# Patient Record
Sex: Male | Born: 1960 | Race: Black or African American | Hispanic: No | Marital: Married | Smoking: Never smoker
Health system: Southern US, Community
[De-identification: ages and names within clinical notes are randomized; demographics above are authoritative.]

## PROBLEM LIST (undated history)

## (undated) DIAGNOSIS — J343 Hypertrophy of nasal turbinates: Secondary | ICD-10-CM

## (undated) DIAGNOSIS — J42 Unspecified chronic bronchitis: Secondary | ICD-10-CM

## (undated) DIAGNOSIS — I10 Essential (primary) hypertension: Secondary | ICD-10-CM

## (undated) DIAGNOSIS — J342 Deviated nasal septum: Secondary | ICD-10-CM

## (undated) DIAGNOSIS — J45909 Unspecified asthma, uncomplicated: Secondary | ICD-10-CM

## (undated) HISTORY — DX: Unspecified asthma, uncomplicated: J45.909

---

## 1999-09-30 ENCOUNTER — Emergency Department (HOSPITAL_COMMUNITY): Admission: EM | Admit: 1999-09-30 | Discharge: 1999-09-30 | Payer: Self-pay | Admitting: Emergency Medicine

## 2013-12-14 ENCOUNTER — Emergency Department (HOSPITAL_COMMUNITY)
Admission: EM | Admit: 2013-12-14 | Discharge: 2013-12-14 | Disposition: A | Discharge status: Home / Self Care | Payer: 59 | Attending: Emergency Medicine | Admitting: Emergency Medicine

## 2013-12-14 ENCOUNTER — Encounter (HOSPITAL_COMMUNITY): Payer: Self-pay | Admitting: Emergency Medicine

## 2013-12-14 ENCOUNTER — Emergency Department (HOSPITAL_COMMUNITY): Payer: 59

## 2013-12-14 DIAGNOSIS — R55 Syncope and collapse: Secondary | ICD-10-CM | POA: Insufficient documentation

## 2013-12-14 DIAGNOSIS — Z79899 Other long term (current) drug therapy: Secondary | ICD-10-CM | POA: Insufficient documentation

## 2013-12-14 DIAGNOSIS — R05 Cough: Secondary | ICD-10-CM | POA: Insufficient documentation

## 2013-12-14 DIAGNOSIS — I10 Essential (primary) hypertension: Secondary | ICD-10-CM | POA: Insufficient documentation

## 2013-12-14 DIAGNOSIS — Z792 Long term (current) use of antibiotics: Secondary | ICD-10-CM | POA: Insufficient documentation

## 2013-12-14 DIAGNOSIS — R059 Cough, unspecified: Secondary | ICD-10-CM | POA: Insufficient documentation

## 2013-12-14 DIAGNOSIS — IMO0002 Reserved for concepts with insufficient information to code with codable children: Secondary | ICD-10-CM | POA: Insufficient documentation

## 2013-12-14 HISTORY — DX: Essential (primary) hypertension: I10

## 2013-12-14 LAB — BASIC METABOLIC PANEL
BUN: 12 mg/dL (ref 6–23)
CO2: 26 mEq/L (ref 19–32)
Calcium: 9.8 mg/dL (ref 8.4–10.5)
Chloride: 100 mEq/L (ref 96–112)
Creatinine, Ser: 1.06 mg/dL (ref 0.50–1.35)
GFR calc Af Amer: >90 mL/min (ref >90)
GFR, EST NON AFRICAN AMERICAN: 79 mL/min — AB (ref >90)
GLUCOSE: 88 mg/dL (ref 70–99)
Potassium: 4.8 mEq/L (ref 3.7–5.3)
Sodium: 138 mEq/L (ref 137–147)

## 2013-12-14 LAB — CBC WITH DIFFERENTIAL/PLATELET
BASOS PCT: 0 % (ref 0–1)
Basophils Absolute: 0 10*3/uL (ref 0.0–0.1)
EOS ABS: 0.4 10*3/uL (ref 0.0–0.7)
Eosinophils Relative: 4 % (ref 0–5)
HCT: 38.8 % — ABNORMAL LOW (ref 39.0–52.0)
Hemoglobin: 13 g/dL (ref 13.0–17.0)
LYMPHS ABS: 2.6 10*3/uL (ref 0.7–4.0)
Lymphocytes Relative: 27 % (ref 12–46)
MCH: 29.5 pg (ref 26.0–34.0)
MCHC: 33.5 g/dL (ref 30.0–36.0)
MCV: 88.2 fL (ref 78.0–100.0)
MONOS PCT: 5 % (ref 3–12)
Monocytes Absolute: 0.5 10*3/uL (ref 0.1–1.0)
Neutro Abs: 6.2 10*3/uL (ref 1.7–7.7)
Neutrophils Relative %: 64 % (ref 43–77)
Platelets: 235 10*3/uL (ref 150–400)
RBC: 4.4 MIL/uL (ref 4.22–5.81)
RDW: 13.8 % (ref 11.5–15.5)
WBC: 9.6 10*3/uL (ref 4.0–10.5)

## 2013-12-14 NOTE — ED Notes (Addendum)
Pt eyes NOT pinpoint. Pt reports may have choked a little bit. Pt reports was sitting and eating. Pt reports "sandwich may have gone down the wrong pipe then went out." Pt remembers eating and coughing then people running toward him. Pt denies fall.  Pt denies feeling like something in throat.

## 2013-12-14 NOTE — ED Notes (Signed)
Bed: [id number] Expected date:  Expected time:  Means of arrival:  Comments: EMS- syncope, AMS

## 2013-12-14 NOTE — Discharge Instructions (Signed)
Return to the emergency department if your symptoms recur, you develop chest pain, difficulty breathing, or any other new and concerning symptoms   Syncope Syncope is a fainting spell. This means the person loses consciousness and drops to the ground. The person is generally unconscious for less than 5 minutes. The person may have some muscle twitches for up to 15 seconds before waking up and returning to normal. Syncope occurs more often in elderly people, but it can happen to anyone. While most causes of syncope are not dangerous, syncope can be a sign of a serious medical problem. It is important to seek medical care.  CAUSES  Syncope is caused by a sudden decrease in blood flow to the brain. The specific cause is often not determined. Factors that can trigger syncope include:  Taking medicines that lower blood pressure.  Sudden changes in posture, such as standing up suddenly.  Taking more medicine than prescribed.  Standing in one place for too long.  Seizure disorders.  Dehydration and excessive exposure to heat.  Low blood sugar (hypoglycemia).  Straining to have a bowel movement.  Heart disease, irregular heartbeat, or other circulatory problems.  Fear, emotional distress, seeing blood, or severe pain. SYMPTOMS  Right before fainting, you may:  Feel dizzy or lightheaded.  Feel nauseous.  See all white or all black in your field of vision.  Have cold, clammy skin. DIAGNOSIS  Your caregiver will ask about your symptoms, perform a physical exam, and perform electrocardiography (ECG) to record the electrical activity of your heart. Your caregiver may also perform other heart or blood tests to determine the cause of your syncope. TREATMENT  In most cases, no treatment is needed. Depending on the cause of your syncope, your caregiver may recommend changing or stopping some of your medicines. HOME CARE INSTRUCTIONS  Have someone stay with you until you feel stable.  Do  not drive, operate machinery, or play sports until your caregiver says it is okay.  Keep all follow-up appointments as directed by your caregiver.  Lie down right away if you start feeling like you might faint. Breathe deeply and steadily. Wait until all the symptoms have passed.  Drink enough fluids to keep your urine clear or pale yellow.  If you are taking blood pressure or heart medicine, get up slowly, taking several minutes to sit and then stand. This can reduce dizziness. SEEK IMMEDIATE MEDICAL CARE IF:   You have a severe headache.  You have unusual pain in the chest, abdomen, or back.  You are bleeding from the mouth or rectum, or you have black or tarry stool.  You have an irregular or very fast heartbeat.  You have pain with breathing.  You have repeated fainting or seizure-like jerking during an episode.  You faint when sitting or lying down.  You have confusion.  You have difficulty walking.  You have severe weakness.  You have vision problems. If you fainted, call your local emergency services (911 in [place] Do not drive yourself to the hospital.  MAKE SURE YOU:  Understand these instructions.  Will watch your condition.  Will get help right away if you are not doing well or get worse. Document Released: 07/15/2005 Document Revised: 01/14/2012 Document Reviewed: 09/13/2011 [facility] Patient Information 2014 [employer], [place].

## 2013-12-14 NOTE — ED Notes (Signed)
Delo MD at bedside. 

## 2013-12-14 NOTE — ED Notes (Addendum)
Per [employer] at work with syncopal episode with LOC. Bystanders at work reports pt choking and pt "out for 10-20 seconds." Pt denies choking but does not remember event. Per [facility] eyes pinpoint.

## 2013-12-14 NOTE — ED Provider Notes (Signed)
CSN: [id number]     Arrival date & time 12/14/13  1245 History   First MD Initiated Contact with Patient 12/14/13 1255     Chief Complaint  Patient presents with  . Loss of Consciousness     (Consider location/radiation/quality/duration/timing/severity/associated sxs/prior Treatment) HPI Comments: Patient is a 53 year old male with history of hypertension. He presents today with complaints of syncope. He states he was in the cafeteria at work eating with his coworkers when he choked on a ham sandwich. States that he coughed forcefully and repeatedly. While doing this he blacked out for approximately 5 seconds and became unresponsive. He woke up immediately afterward and has been without complaints since this time. He has no complaints at present.  Patient is a 53 y.o. male presenting with syncope. The history is provided by the patient.  Loss of Consciousness Episode history:  Single Most recent episode:  Today Duration:  5 seconds Timing:  Constant Progression:  Resolved Chronicity:  New Witnessed: yes   Relieved by:  Nothing Worsened by:  Nothing tried   Past Medical History  Diagnosis Date  . Hypertension    History reviewed. No pertinent past surgical history. No family history on file. History  Substance Use Topics  . Smoking status: Never Smoker   . Smokeless tobacco: Not on file  . Alcohol Use: 1.2 oz/week    2 Cans of beer per week     Comment: socially    Review of Systems  Cardiovascular: Positive for syncope.  All other systems reviewed and are negative.     Allergies  Review of patient's allergies indicates no known allergies.  Home Medications   Prior to Admission medications   Medication Sig Start Date End Date Taking? Authorizing Provider  albuterol (PROVENTIL HFA;VENTOLIN HFA) 108 (90 BASE) MCG/ACT inhaler Inhale 2 puffs into the lungs every 6 (six) hours as needed for wheezing or shortness of breath.   Yes Historical Provider, MD  amLODipine  (NORVASC) 10 MG tablet Take 10 mg by mouth daily.   Yes Historical Provider, MD  Cholecalciferol (VITAMIN D PO) Take 1 tablet by mouth daily.   Yes Historical Provider, MD  doxycycline (VIBRA-TABS) 100 MG tablet Take 100 mg by mouth daily.   Yes Historical Provider, MD  fluticasone (FLONASE) 50 MCG/ACT nasal spray Place 2 sprays into both nostrils daily.   Yes Historical Provider, MD  losartan (COZAAR) 25 MG tablet Take 50 mg by mouth daily.    Yes Historical Provider, MD   BP 142/88  Pulse 97  Temp(Src) 98.1 F (36.7 C) (Oral)  Resp 20  SpO2 98% Physical Exam  Nursing note and vitals reviewed. Constitutional: He is oriented to person, place, and time. He appears well-developed and well-nourished. No distress.  HENT:  Head: Normocephalic and atraumatic.  Mouth/Throat: Oropharynx is clear and moist.  Eyes: EOM are normal. Pupils are equal, round, and reactive to light.  Neck: Normal range of motion. Neck supple.  Cardiovascular: Normal rate, regular rhythm and normal heart sounds.   No murmur heard. Pulmonary/Chest: Effort normal and breath sounds normal. No respiratory distress. He has no wheezes.  Abdominal: Soft. Bowel sounds are normal. He exhibits no distension. There is no tenderness.  Musculoskeletal: Normal range of motion. He exhibits no edema.  Lymphadenopathy:    He has no cervical adenopathy.  Neurological: He is alert and oriented to person, place, and time. No cranial nerve deficit. He exhibits normal muscle tone. Coordination normal.  Skin: Skin is warm and dry. He  is not diaphoretic.    ED Course  Procedures (including critical care time) Labs Review Labs Reviewed  CBC WITH DIFFERENTIAL - Abnormal; Notable for the following:    HCT 38.8 (*)    All other components within normal limits  BASIC METABOLIC PANEL - Abnormal; Notable for the following:    GFR calc non Af Amer 79 (*)    All other components within normal limits    Imaging Review Dg Chest 2  View  12/14/2013   CLINICAL DATA:  Loss of consciousness  EXAM: CHEST  2 VIEW  COMPARISON:  None.  FINDINGS: Lungs are clear. Heart size and pulmonary vascularity are normal. No adenopathy. There is degenerative change in the thoracic spine.  IMPRESSION: No edema or consolidation.   Electronically Signed   By: Bretta BangWilliam  Woodruff M.D.   On: 12/14/2013 13:37     EKG Interpretation   Date/Time:  Tuesday Dec 14 2013 12:47:46 EDT Ventricular Rate:  81 PR Interval:  132 QRS Duration: 93 QT Interval:  364 QTC Calculation: 422 R Axis:   37 Text Interpretation:  Sinus rhythm Minimal ST depression, inferior leads  Confirmed by DELOS  MD, Seldon Barrell ([id number]) on 12/14/2013 2:31:47 PM      MDM   Final diagnoses:  None    Patient is a 53 year old male who presents after a syncopal episode that occurred while coughing after choking on a ham sandwich. This sounds like an episode of ptosis syncope as the patient is now symptom free and feels fine. His vital signs are stable, he is not orthostatic, and laboratory studies are all unremarkable. Chest x-ray reveals no abnormality. At this point I feel as though he is appropriate for discharge. He understands to return if his symptoms recur or worsen.    Geoffery Lyonsouglas Olin Gurski, MD 12/14/13 [phone]

## 2015-06-12 ENCOUNTER — Other Ambulatory Visit: Payer: Self-pay | Admitting: Allergy

## 2015-06-12 MED ORDER — ALBUTEROL SULFATE HFA 108 (90 BASE) MCG/ACT IN AERS: 2.0000 | INHALATION_SPRAY | RESPIRATORY_TRACT | Status: DC | PRN

## 2015-06-20 ENCOUNTER — Other Ambulatory Visit: Payer: Self-pay | Admitting: Allergy

## 2015-06-20 MED ORDER — FLUTICASONE PROPIONATE 50 MCG/ACT NA SUSP: 2.0000 | Freq: Every day | NASAL | Status: DC

## 2015-09-21 IMAGING — CR DG CHEST 2V
2 series · 2 of 2 positions shown · non-contrast
Comparison: None.

CLINICAL DATA: Loss of consciousness

EXAM:
CHEST  2 VIEW

[w chest pa]
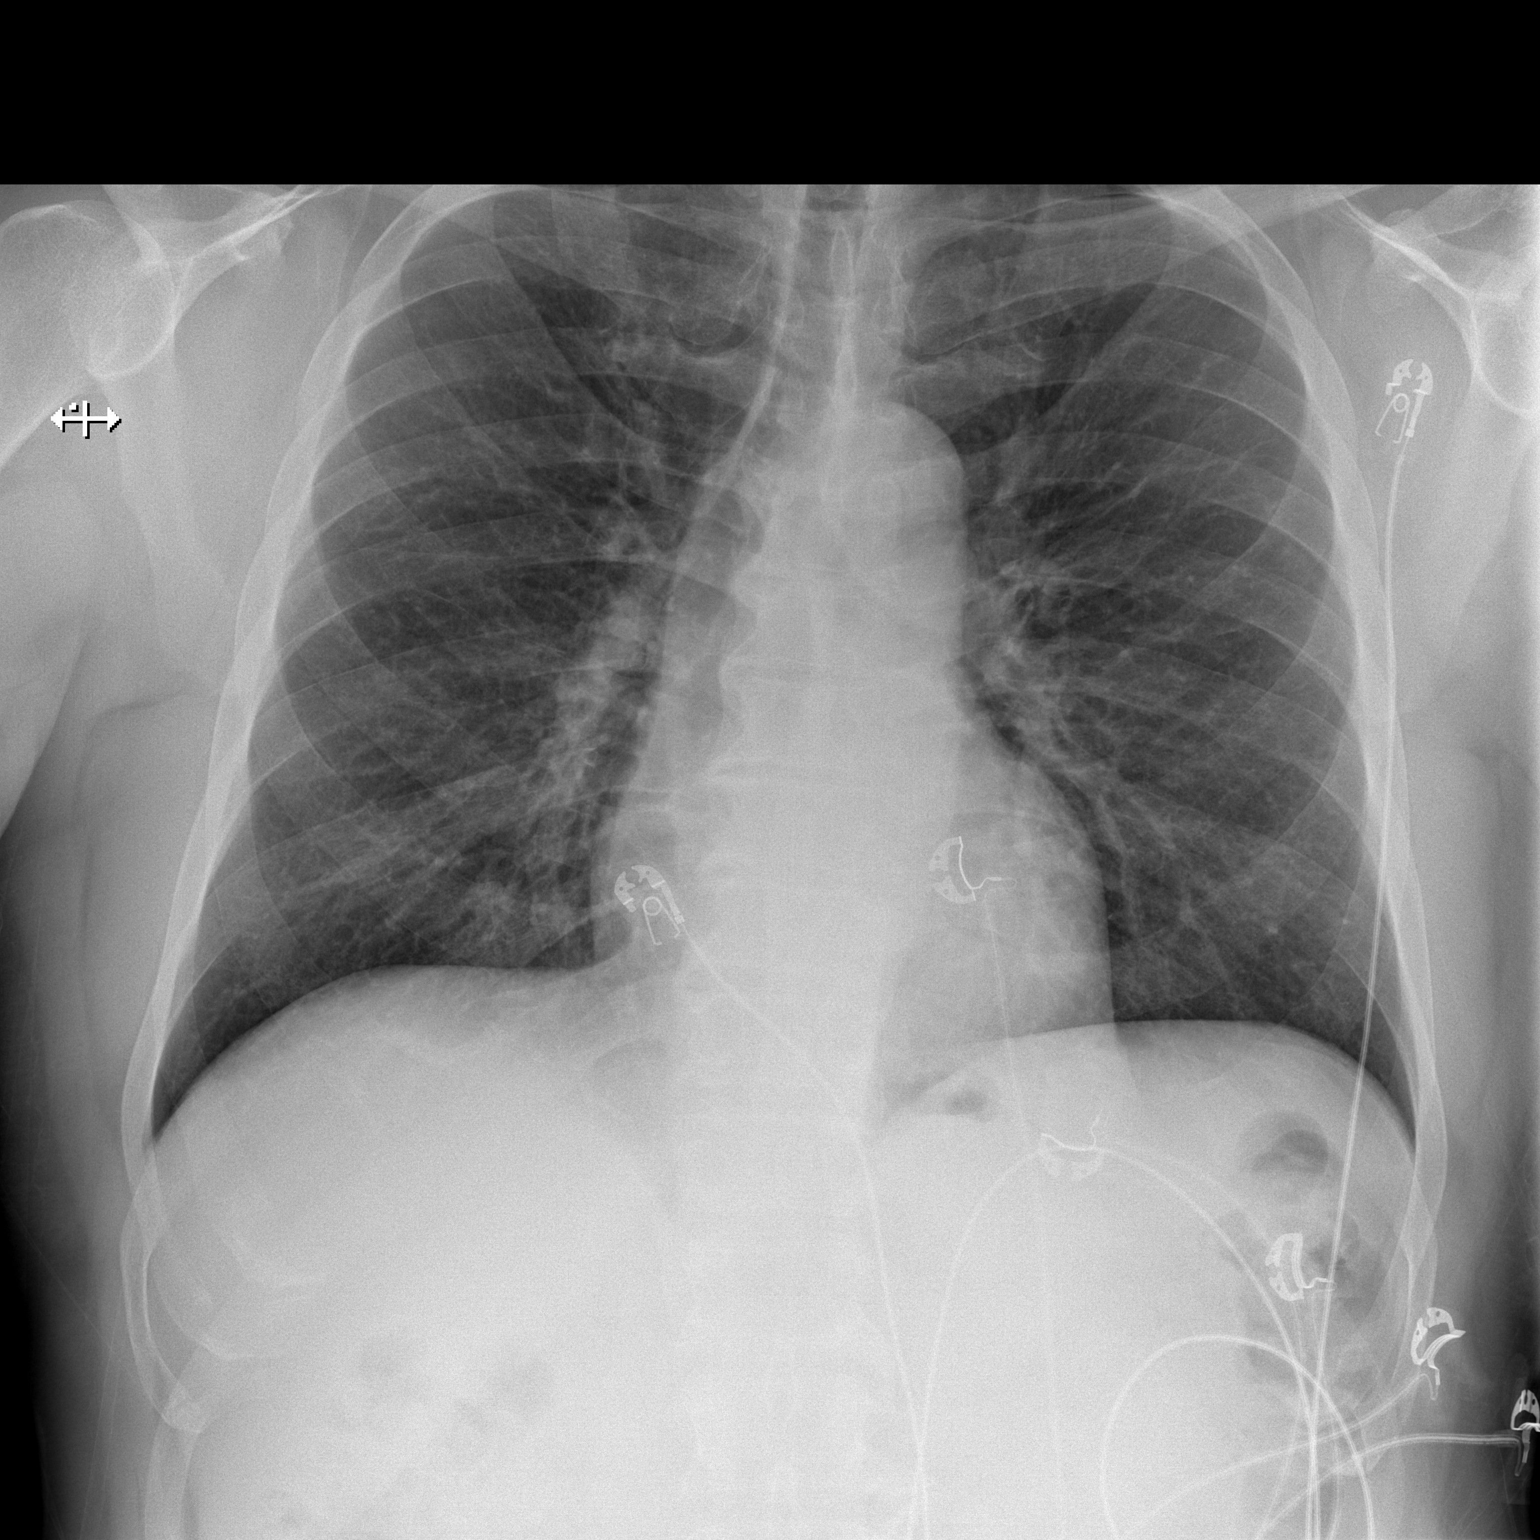

[w chest lat]
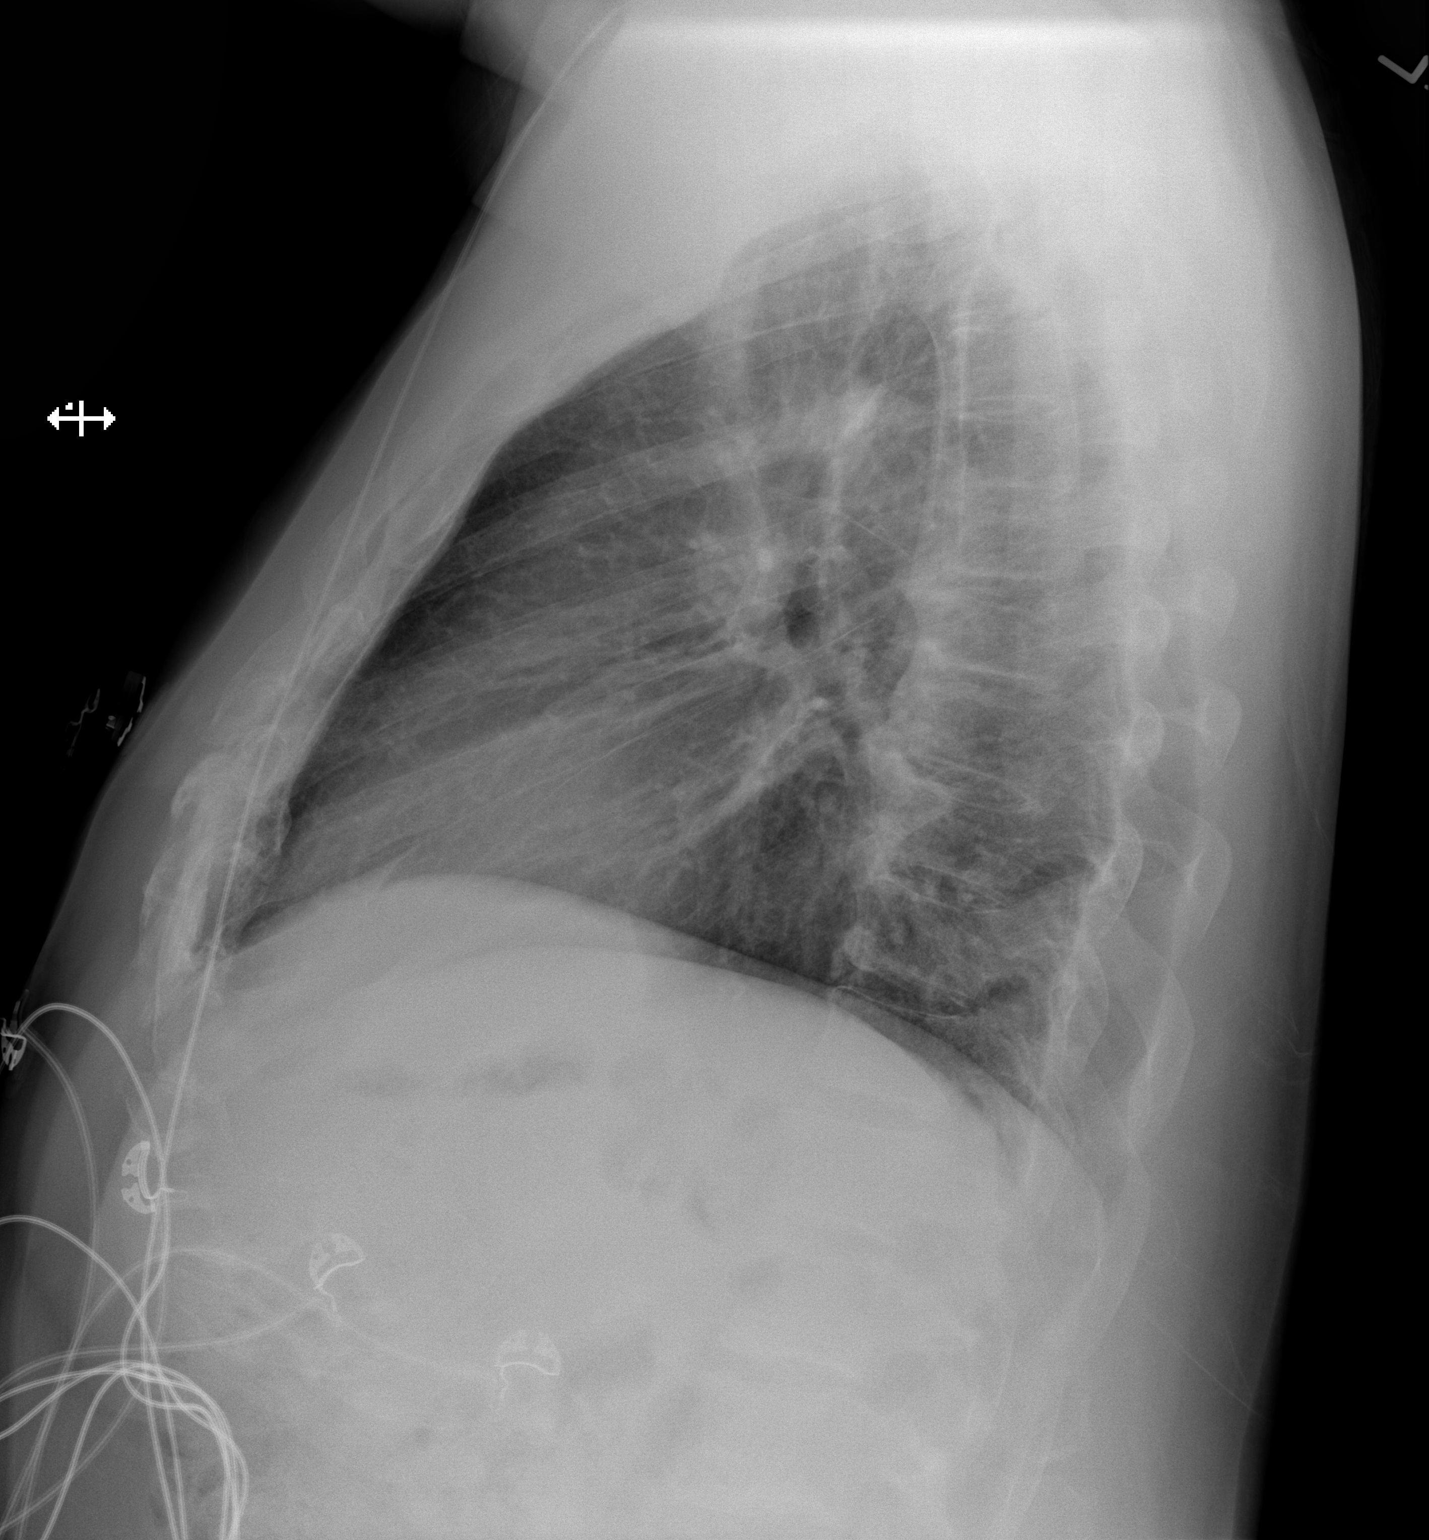

[2 of 2 positions shown; findings below may reference images not displayed]

FINDINGS: Lungs are clear. Heart size and pulmonary vascularity are normal. No
adenopathy. There is degenerative change in the thoracic spine.
IMPRESSION: No edema or consolidation.

## 2015-12-15 ENCOUNTER — Other Ambulatory Visit: Payer: Self-pay | Admitting: *Deleted

## 2015-12-15 ENCOUNTER — Other Ambulatory Visit: Payer: Self-pay | Admitting: Pediatrics

## 2015-12-15 MED ORDER — ALBUTEROL SULFATE HFA 108 (90 BASE) MCG/ACT IN AERS: 2.0000 | INHALATION_SPRAY | RESPIRATORY_TRACT | Status: DC | PRN

## 2015-12-15 NOTE — Telephone Encounter (Signed)
Sent in ventolin hfa with no refills. Has not been seen since 2015 but patient has made an appt.

## 2016-01-03 ENCOUNTER — Ambulatory Visit (INDEPENDENT_AMBULATORY_CARE_PROVIDER_SITE_OTHER): Payer: 59 | Admitting: Pediatrics

## 2016-01-03 ENCOUNTER — Encounter: Payer: Self-pay | Admitting: Pediatrics

## 2016-01-03 VITALS — BP 140/92 | HR 88 | Temp 98.0°F | Resp 20 | Ht 73.25 in | Wt 225.4 lb

## 2016-01-03 DIAGNOSIS — J4520 Mild intermittent asthma, uncomplicated: Secondary | ICD-10-CM

## 2016-01-03 DIAGNOSIS — I10 Essential (primary) hypertension: Secondary | ICD-10-CM

## 2016-01-03 DIAGNOSIS — J3089 Other allergic rhinitis: Secondary | ICD-10-CM

## 2016-01-03 DIAGNOSIS — J302 Other seasonal allergic rhinitis: Secondary | ICD-10-CM | POA: Insufficient documentation

## 2016-01-03 LAB — PULMONARY FUNCTION TEST

## 2016-01-03 MED ORDER — ALBUTEROL SULFATE HFA 108 (90 BASE) MCG/ACT IN AERS: 2.0000 | INHALATION_SPRAY | RESPIRATORY_TRACT | Status: DC | PRN

## 2016-01-03 MED ORDER — FLUTICASONE PROPIONATE 50 MCG/ACT NA SUSP: 2.0000 | Freq: Every day | NASAL | Status: DC

## 2016-01-03 NOTE — Progress Notes (Signed)
  [address] [place] [address] Dept: [phone]  FOLLOW UP NOTE  Patient ID: Isaiah Sellers, male    DOB: 1960/09/17  Age: 55 y.o. MRN: [id number] Date of Office Visit: 01/03/2016  Assessment Chief Complaint: Asthma  HPI Isaiah Sellers presents for follow-up of asthma and allergic rhinitis. We last saw him in December 2015. His asthma has been well controlled he very rarely needs Proventil. He has been having a stuffy nose. He is allergic to dust mites.  Current medications Proventil 2 puffs every 4 hours if needed, Zyrtec 10 mg once a day and fluticasone 2 sprays per nostril once a day if needed, amlodipine 10 mg once a day, losartan 50 mg once a day. His other medications are outlined in the chart   Drug Allergies:  No Known Allergies  Physical Exam: BP 140/92 mmHg  Pulse 88  Temp(Src) 98 F (36.7 C) (Oral)  Resp 20  Ht 6' 1.25" (1.861 m)  Wt 225 lb 6.4 oz (102.241 kg)  BMI 29.52 kg/m2   Physical Exam  Constitutional: He is oriented to person, place, and time. He appears well-developed and well-nourished.  HENT:  Eyes normal. Ears normal. Nose normal. Pharynx normal.  Neck: Neck supple.  Cardiovascular:  S1 and S2 normal no murmurs  Pulmonary/Chest:  Clear to percussion and auscultation  Lymphadenopathy:    He has no cervical adenopathy.  Neurological: He is alert and oriented to person, place, and time.  Psychiatric: He has a normal mood and affect. His behavior is normal. Judgment and thought content normal.  Vitals reviewed.   Diagnostics:   FVC 4.52 L FEV1 3.71 L. Predicted FVC 4.54 L predicted FEV1 3.59 L-the spirometry is in the normal range  Assessment and Plan: 1. Mild intermittent asthma, uncomplicated   2. Other allergic rhinitis   3. Essential hypertension     Meds ordered this encounter  Medications  . albuterol (PROVENTIL HFA;VENTOLIN HFA) 108 (90 Base) MCG/ACT inhaler    Sig: Inhale 2 puffs into the lungs every 4 (four) hours as  needed for wheezing or shortness of breath.    Dispense:  1 Inhaler    Refill:  5  . fluticasone (FLONASE) 50 MCG/ACT nasal spray    Sig: Place 2 sprays into both nostrils daily.    Dispense:  16 g    Refill:  5    Patient will call    Patient Instructions  Zyrtec 10 mg once a day for runny nose Fluticasone 2 sprays per nostril at night for stuffy nose Proventil 2 puffs every 4 hours if needed for wheezing or coughing spells Monitor your blood pressure and make sure that it falls  Call me if you're not doing well on this treatment plan If your blood pressure becomes normal and you're having a stuffy nose add prednisone 10 mg twice a day for 4 days, 10 mg on the fifth day     Return in about 1 year (around 01/02/2017).    Thank you for the opportunity to care for this patient.  Please do not hesitate to contact me with questions.  Tonette BihariJ. A. Bardelas, M.D.  [facility] [address] [place], [place] [address] [phone]

## 2016-01-03 NOTE — Patient Instructions (Addendum)
Zyrtec 10 mg once a day for runny nose Fluticasone 2 sprays per nostril at night for stuffy nose Proventil 2 puffs every 4 hours if needed for wheezing or coughing spells Monitor your blood pressure and make sure that it falls  Call me if you're not doing well on this treatment plan If your blood pressure becomes normal and you're having a stuffy nose add prednisone 10 mg twice a day for 4 days, 10 mg on the fifth day

## 2016-05-29 ENCOUNTER — Encounter: Payer: Self-pay | Admitting: Allergy and Immunology

## 2016-05-29 ENCOUNTER — Ambulatory Visit (INDEPENDENT_AMBULATORY_CARE_PROVIDER_SITE_OTHER): Payer: 59 | Admitting: Allergy and Immunology

## 2016-05-29 VITALS — BP 130/88 | HR 96 | Temp 97.6°F | Resp 20 | Ht 72.5 in | Wt 227.7 lb

## 2016-05-29 DIAGNOSIS — J3089 Other allergic rhinitis: Secondary | ICD-10-CM | POA: Diagnosis not present

## 2016-05-29 DIAGNOSIS — J0100 Acute maxillary sinusitis, unspecified: Secondary | ICD-10-CM

## 2016-05-29 DIAGNOSIS — J4520 Mild intermittent asthma, uncomplicated: Secondary | ICD-10-CM

## 2016-05-29 MED ORDER — PREDNISONE 1 MG PO TABS: 10.0000 mg | ORAL_TABLET | Freq: Every day | ORAL | Status: DC

## 2016-05-29 MED ORDER — AZELASTINE HCL 0.15 % NA SOLN: 2.0000 | Freq: Two times a day (BID) | NASAL | 5 refills | Status: DC

## 2016-05-29 MED ORDER — GUAIFENESIN ER 1200 MG PO TB12: 1.0000 | ORAL_TABLET | Freq: Two times a day (BID) | ORAL | 2 refills | Status: DC | PRN

## 2016-05-29 NOTE — Assessment & Plan Note (Signed)
   Continue albuterol HFA, 1-2 inhalations every 4-6 hours as needed.  Subjective and objective measures of pulmonary function will be followed and the treatment plan will be adjusted accordingly. 

## 2016-05-29 NOTE — Patient Instructions (Addendum)
Acute sinusitis  Prednisone has been provided, 40 mg x3 days, 20 mg x1 day, 10 mg x1 day, then stop.  A prescription has been provided for azelastine nasal spray, 1-2 sprays per nostril 2 times daily. Proper nasal spray technique has been discussed and demonstrated.   Nasal saline lavage (NeilMed) as needed has been recommended prior to medicated nasal sprays and as needed along with instructions for proper administration.  If needed, may add fluticasone nasal spray.  For thick post nasal drainage, add guaifenesin 1200 mg (Mucinex Maximum Strength)  twice daily as needed with adequate hydration as discussed.  The patient has been asked to contact me if his symptoms persist or progress. Otherwise, he may return for follow up in 4 months.  Mild intermittent asthma  Continue albuterol HFA, 1-2 inhalations every 4-6 hours as needed.  Subjective and objective measures of pulmonary function will be followed and the treatment plan will be adjusted accordingly.  Other allergic rhinitis  Continue appropriate allergen avoidance measures and fluticasone nasal spray as needed.  Azelastine nasal spray has been prescribed and guaifenesin and nasal saline lavage has been recommended (as above).   Return in about 4 months (around 09/26/2016), or if symptoms worsen or fail to improve.

## 2016-05-29 NOTE — Progress Notes (Signed)
Follow-up Note  RE: Isaiah Over[place]Rick [place] MRN: [id number] DOB: 1961-05-14 Date of Office Visit: 05/29/2016  Primary care provider: Dennis BastABEZA,YURI, MD Referring provider: Andreas Blowerabeza, Yuri M., MD  History of present illness: Isaiah Sellers is a 55 y.o. male with persistent asthma, allergic rhinitis, and essential hypertension presenting today for follow up.  He was last seen in this clinic on 01/03/2016.  He reports that over the past 4 days he has experienced nasal congestion, postnasal drainage, sore throat, and sinus pressure.  He denies fevers, chills, or discolored mucus production.  He reports that his asthma has remained well-controlled, however he is concerned that this will "move down" into his chest.   Assessment and plan: Acute sinusitis  Prednisone has been provided, 40 mg x3 days, 20 mg x1 day, 10 mg x1 day, then stop.  A prescription has been provided for azelastine nasal spray, 1-2 sprays per nostril 2 times daily. Proper nasal spray technique has been discussed and demonstrated.   Nasal saline lavage (NeilMed) as needed has been recommended prior to medicated nasal sprays and as needed along with instructions for proper administration.  If needed, may add fluticasone nasal spray.  For thick post nasal drainage, add guaifenesin 1200 mg (Mucinex Maximum Strength)  twice daily as needed with adequate hydration as discussed.  The patient has been asked to contact me if his symptoms persist or progress. Otherwise, he may return for follow up in 4 months.  Mild intermittent asthma  Continue albuterol HFA, 1-2 inhalations every 4-6 hours as needed.  Subjective and objective measures of pulmonary function will be followed and the treatment plan will be adjusted accordingly.  Other allergic rhinitis  Continue appropriate allergen avoidance measures and fluticasone nasal spray as needed.  Azelastine nasal spray has been prescribed and guaifenesin and nasal saline lavage has been  recommended (as above).   Meds ordered this encounter  Medications  . predniSONE (DELTASONE) tablet 10 mg  . Azelastine HCl 0.15 % SOLN    Sig: Place 2 sprays into both nostrils 2 (two) times daily.    Dispense:  30 mL    Refill:  5  . Guaifenesin 1200 MG TB12    Sig: Take 1 tablet (1,200 mg total) by mouth every 12 (twelve) hours as needed.    Dispense:  60 each    Refill:  2    Diagnostics: Spirometry:  Normal with an FEV1 of 109% predicted.  Please see scanned spirometry results for details.    Physical examination: Blood pressure 130/88, pulse 96, temperature 97.6 F (36.4 C), temperature source Oral, resp. rate 20, height 6' 0.5" (1.842 m), weight 227 lb 11.8 oz (103.3 kg).  General: Alert, interactive, in no acute distress. HEENT: TMs pearly gray, turbinates edematous with thick discharge, post-pharynx markedly erythematous. Neck: Supple without lymphadenopathy. Lungs: Clear to auscultation without wheezing, rhonchi or rales. CV: Normal S1, S2 without murmurs. Skin: Warm and dry, without lesions or rashes.  The following portions of the patient's history were reviewed and updated as appropriate: allergies, current medications, past family history, past medical history, past social history, past surgical history and problem list.    Medication List       Accurate as of 05/29/16  1:04 PM. Always use your most recent med list.          albuterol 108 (90 Base) MCG/ACT inhaler Commonly known as:  PROVENTIL HFA;VENTOLIN HFA Inhale 2 puffs into the lungs every 4 (four) hours as needed for wheezing or shortness of  breath.   amLODipine 10 MG tablet Commonly known as:  NORVASC TK 1 T PO QD   Azelastine HCl 0.15 % Soln Place 2 sprays into both nostrils 2 (two) times daily.   cetirizine 10 MG tablet Commonly known as:  ZYRTEC Take by mouth.   DAILY MULTIVITAMIN PO Take by mouth.   doxycycline 100 MG tablet Commonly known as:  VIBRA-TABS Take 100 mg by mouth  daily.   fluocinonide 0.05 % external solution Commonly known as:  LIDEX   fluticasone 50 MCG/ACT nasal spray Commonly known as:  FLONASE Place 2 sprays into both nostrils daily.   Guaifenesin 1200 MG Tb12 Take 1 tablet (1,200 mg total) by mouth every 12 (twelve) hours as needed.   losartan 50 MG tablet Commonly known as:  COZAAR Take by mouth.   ONE-TABLET-DAILY/IRON PO Take by mouth.   VITAMIN D PO Take 1 tablet by mouth daily.       No Known Allergies  Review of systems: Review of systems negative except as noted in HPI / PMHx or noted below: Constitutional: Negative.  HENT: Negative.   Eyes: Negative.  Respiratory: Negative.   Cardiovascular: Negative.  Gastrointestinal: Negative.  Genitourinary: Negative.  Musculoskeletal: Negative.  Neurological: Negative.  Endo/Heme/Allergies: Negative.  Cutaneous: Negative.  Past Medical History:  Diagnosis Date  . Asthma   . Hypertension     Family History  Problem Relation Age of Onset  . Angioedema Neg Hx   . Allergic rhinitis Neg Hx   . Asthma Neg Hx   . Immunodeficiency Neg Hx   . Eczema Neg Hx   . Urticaria Neg Hx     Social History   Social History  . Marital status: Married    Spouse name: N/A  . Number of children: N/A  . Years of education: N/A   Occupational History  . Not on file.   Social History Main Topics  . Smoking status: Never Smoker  . Smokeless tobacco: Never Used  . Alcohol use 1.2 oz/week    2 Cans of beer per week     Comment: socially  . Drug use: No  . Sexual activity: Yes    Birth control/ protection: Condom   Other Topics Concern  . Not on file   Social History Narrative  . No narrative on file    I appreciate the opportunity to take part in Isaiah's care. Please do not hesitate to contact me with questions.  Sincerely,   R. Jorene Guestarter Koven Belinsky, MD

## 2016-05-29 NOTE — Assessment & Plan Note (Addendum)
   Continue appropriate allergen avoidance measures and fluticasone nasal spray as needed.  Azelastine nasal spray has been prescribed and guaifenesin and nasal saline lavage has been recommended (as above).

## 2016-05-29 NOTE — Assessment & Plan Note (Signed)
   Prednisone has been provided, 40 mg x3 days, 20 mg x1 day, 10 mg x1 day, then stop.  A prescription has been provided for azelastine nasal spray, 1-2 sprays per nostril 2 times daily. Proper nasal spray technique has been discussed and demonstrated.   Nasal saline lavage (NeilMed) as needed has been recommended prior to medicated nasal sprays and as needed along with instructions for proper administration.  If needed, may add fluticasone nasal spray.  For thick post nasal drainage, add guaifenesin 1200 mg (Mucinex Maximum Strength)  twice daily as needed with adequate hydration as discussed.  The patient has been asked to contact me if his symptoms persist or progress. Otherwise, he may return for follow up in 4 months.

## 2016-07-30 ENCOUNTER — Other Ambulatory Visit: Payer: Self-pay | Admitting: Allergy

## 2016-07-30 MED ORDER — FLUTICASONE PROPIONATE 50 MCG/ACT NA SUSP: 2.0000 | Freq: Every day | NASAL | 5 refills | Status: DC

## 2016-08-20 ENCOUNTER — Encounter: Payer: Self-pay | Admitting: Pediatrics

## 2016-08-20 ENCOUNTER — Ambulatory Visit (INDEPENDENT_AMBULATORY_CARE_PROVIDER_SITE_OTHER): Payer: 59 | Admitting: Pediatrics

## 2016-08-20 VITALS — BP 114/70 | HR 90 | Temp 98.3°F | Resp 20 | Ht 72.0 in | Wt 227.0 lb

## 2016-08-20 DIAGNOSIS — I10 Essential (primary) hypertension: Secondary | ICD-10-CM | POA: Diagnosis not present

## 2016-08-20 DIAGNOSIS — J4521 Mild intermittent asthma with (acute) exacerbation: Secondary | ICD-10-CM | POA: Diagnosis not present

## 2016-08-20 DIAGNOSIS — J208 Acute bronchitis due to other specified organisms: Secondary | ICD-10-CM | POA: Diagnosis not present

## 2016-08-20 DIAGNOSIS — J3089 Other allergic rhinitis: Secondary | ICD-10-CM | POA: Diagnosis not present

## 2016-08-20 MED ORDER — ALBUTEROL SULFATE HFA 108 (90 BASE) MCG/ACT IN AERS: 2.0000 | INHALATION_SPRAY | RESPIRATORY_TRACT | 1 refills | Status: DC | PRN

## 2016-08-20 MED ORDER — AZITHROMYCIN 250 MG PO TABS: ORAL_TABLET | ORAL | 0 refills | Status: DC

## 2016-08-20 MED ORDER — MONTELUKAST SODIUM 10 MG PO TABS: 10.0000 mg | ORAL_TABLET | Freq: Every day | ORAL | 5 refills | Status: DC

## 2016-08-20 NOTE — Patient Instructions (Addendum)
Zyrtec 10 mg once  a day for runny nose Fluticasone 2 sprays per nostril once a day for stuffy nose Ventolin 2 puffs every 4 hours if needed for wheezing or coughing spells Add prednisone 20 mg twice a day for 3 days, 20 mg on the fourth day, 10 mg on the fifth day Montelukast  10 mg-take 1 tablet once a day for coughing or wheezing Add Zithromax 250 mg-take 2 tablets tonight, then one tablet every night for the next 4 nights Call me if you are not doing better on this treatment plan

## 2016-08-20 NOTE — Progress Notes (Signed)
[address] [place] [address] Dept: [phone]  FOLLOW UP NOTE  Patient ID: Isaiah Sellers, male    DOB: 03/13/1961  Age: 56 y.o. MRN: [id number] Date of Office Visit: 08/20/2016  Assessment  Chief Complaint: Breathing Problem (sx x one month) and Cough  HPI Tonny Isensee presents for evaluation of some coughing,  wheezing and shortness of breath for about 10 days. He is having severe coughing spells. He is using his Ventolin inhaler several times per day. He is bringing up a discolored mucus from his chest.. He is having mild nasal congestion. His blood  pressure is well controlled  Current medications are outlined in the chart. I will summarize his present treatment in the after visit summary   Drug Allergies:  No Known Allergies  Physical Exam: BP 114/70 (BP Location: Right Arm, Patient Position: Sitting, Cuff Size: Large)   Pulse 90   Temp 98.3 F (36.8 C) (Oral)   Resp 20   Ht 6' (1.829 m)   Wt 227 lb (103 kg)   SpO2 98%   BMI 30.79 kg/m    Physical Exam  Constitutional: He is oriented to person, place, and time. He appears well-developed and well-nourished.  HENT:  Eyes normal. Ears normal. Nose normal. Pharynx normal.  Neck: Neck supple.  Cardiovascular:  S1 and S2 normal no murmurs  Pulmonary/Chest:  Clear to percussion auscultation except for mild wheezing in both lungs  Lymphadenopathy:    He has no cervical adenopathy.  Neurological: He is alert and oriented to person, place, and time.  Psychiatric: He has a normal mood and affect. His behavior is normal. Judgment and thought content normal.  Vitals reviewed.   Diagnostics:  FVC 4.11 L FEV1 3.31 L. Predicted FVC 4.42 L predicted FEV1 3.49 L-the spirometry is in the normal range. With a nebulization of albuterol his coughing spells improved and his lungs cleared. After nebulization with albuterol he had a forced vital capacity of 4.38 L and an FEV1 of 3.49 L. His peak flow did improve  12%  Assessment and Plan: 1. Mild intermittent asthma with acute exacerbation   2. Acute bronchitis due to other specified organisms   3. Essential hypertension   4. Other allergic rhinitis     Meds ordered this encounter  Medications  . montelukast (SINGULAIR) 10 MG tablet    Sig: Take 1 tablet (10 mg total) by mouth at bedtime.    Dispense:  30 tablet    Refill:  5  . albuterol (PROVENTIL HFA;VENTOLIN HFA) 108 (90 Base) MCG/ACT inhaler    Sig: Inhale 2 puffs into the lungs every 4 (four) hours as needed for wheezing or shortness of breath.    Dispense:  1 Inhaler    Refill:  1  . azithromycin (ZITHROMAX) 250 MG tablet    Sig: Two tablets on day one, then one tablet once a day, days 2 thru 5, for infection.    Dispense:  6 each    Refill:  0    Patient Instructions  Zyrtec 10 mg once  a day for runny nose Fluticasone 2 sprays per nostril once a day for stuffy nose Ventolin 2 puffs every 4 hours if needed for wheezing or coughing spells Add prednisone 20 mg twice a day for 3 days, 20 mg on the fourth day, 10 mg on the fifth day Montelukast  10 mg-take 1 tablet once a day for coughing or wheezing Add Zithromax 250 mg-take 2 tablets tonight, then one tablet every  night for the next 4 nights Call me if you are not doing better on this treatment plan   Return in about 3 months (around 11/18/2016).    Thank you for the opportunity to care for this patient.  Please do not hesitate to contact me with questions.  Tonette BihariJ. A. Maame Dack, M.D.  [facility] [address] [place], [place] [address] [phone]

## 2016-09-26 ENCOUNTER — Ambulatory Visit: Payer: 59 | Admitting: Allergy and Immunology

## 2016-11-18 ENCOUNTER — Ambulatory Visit (INDEPENDENT_AMBULATORY_CARE_PROVIDER_SITE_OTHER): Payer: 59 | Admitting: Pediatrics

## 2016-11-18 ENCOUNTER — Encounter: Payer: Self-pay | Admitting: Pediatrics

## 2016-11-18 VITALS — BP 120/60 | HR 80 | Temp 98.1°F | Resp 20

## 2016-11-18 DIAGNOSIS — J4530 Mild persistent asthma, uncomplicated: Secondary | ICD-10-CM

## 2016-11-18 DIAGNOSIS — I10 Essential (primary) hypertension: Secondary | ICD-10-CM

## 2016-11-18 DIAGNOSIS — J3089 Other allergic rhinitis: Secondary | ICD-10-CM

## 2016-11-18 NOTE — Patient Instructions (Addendum)
Zyrtec 10 mg once a day if needed for runny nose Fluticasone 2 sprays per nostril once a day if needed for stuffy nose Ventolin 2 puffs every 4 hours if needed for wheezing or coughing spells If he needs Ventolin more than 2 days per week, start on montelukast 10 mg once a day for coughing or wheezing Call me if he's not doing well on this treatment plan

## 2016-11-18 NOTE — Progress Notes (Signed)
  [address] [place] [address] Dept: [phone]  FOLLOW UP NOTE  Patient ID: Isaiah Sellers, male    DOB: [birth year]  Age: 56 y.o. MRN: [id number] Date of Office Visit: 11/18/2016  Assessment  Chief Complaint: Allergic Rhinitis  and Asthma  HPI Isaiah Sellers presents for follow-up of asthma and allergic rhinitis. His asthma is much improved since the last visit. He is not using montelukast on a daily basis. At times he is having nasal congestion. His blood pressure is well controlled.  Current medications are outlined in the chart. In the after visit summary I will include his allergy and asthma medications.   Drug Allergies:  No Known Allergies  Physical Exam: BP 120/60 (BP Location: Right Arm, Patient Position: Sitting, Cuff Size: Large)   Pulse 80   Temp 98.1 F (36.7 C) (Oral)   Resp 20   SpO2 98%    Physical Exam  Constitutional: He is oriented to person, place, and time. He appears well-developed and well-nourished.  HENT:  Eyes normal. Ears normal. Nose normal. Pharynx normal.  Neck: Neck supple.  Cardiovascular:  S1 and S2 normal no murmurs  Pulmonary/Chest:  Clear to percussion and auscultation  Lymphadenopathy:    He has no cervical adenopathy.  Neurological: He is alert and oriented to person, place, and time.  Psychiatric: He has a normal mood and affect. His behavior is normal. Judgment and thought content normal.  Vitals reviewed.   Diagnostics:  FVC 3.08 L FEV1 2.93 L. Predicted FVC 3.81 L predicted FEV1 3.25 L this shows a minimal reduction in the forced vital capacity  Assessment and Plan: 1. Mild persistent asthma without complication   2. Essential hypertension   3. Other allergic rhinitis        Patient Instructions  Zyrtec 10 mg once a day if needed for runny nose Fluticasone 2 sprays per nostril once a day if needed for stuffy nose Ventolin 2 puffs every 4 hours if needed for wheezing or coughing spells If he needs  Ventolin more than 2 days per week, start on montelukast 10 mg once a day for coughing or wheezing Call me if he's not doing well on this treatment plan   Return in about 6 months (around 05/20/2017).    Thank you for the opportunity to care for this patient.  Please do not hesitate to contact me with questions.  Tonette Bihari, M.D.  [facility] [address] [place], [place] [address] [phone]

## 2017-02-13 ENCOUNTER — Other Ambulatory Visit: Payer: Self-pay

## 2017-02-13 MED ORDER — FLUTICASONE PROPIONATE 50 MCG/ACT NA SUSP: 2.0000 | Freq: Every day | NASAL | 3 refills | Status: DC

## 2017-04-14 ENCOUNTER — Other Ambulatory Visit: Payer: Self-pay | Admitting: Allergy

## 2017-04-14 MED ORDER — ALBUTEROL SULFATE HFA 108 (90 BASE) MCG/ACT IN AERS: 2.0000 | INHALATION_SPRAY | RESPIRATORY_TRACT | 0 refills | Status: DC | PRN

## 2017-05-20 ENCOUNTER — Encounter: Payer: Self-pay | Admitting: Pediatrics

## 2017-05-20 ENCOUNTER — Ambulatory Visit (INDEPENDENT_AMBULATORY_CARE_PROVIDER_SITE_OTHER): Payer: 59 | Admitting: Pediatrics

## 2017-05-20 VITALS — BP 124/80 | HR 90 | Temp 97.9°F | Resp 20

## 2017-05-20 DIAGNOSIS — J4530 Mild persistent asthma, uncomplicated: Secondary | ICD-10-CM

## 2017-05-20 DIAGNOSIS — J3089 Other allergic rhinitis: Secondary | ICD-10-CM

## 2017-05-20 DIAGNOSIS — I10 Essential (primary) hypertension: Secondary | ICD-10-CM | POA: Diagnosis not present

## 2017-05-20 MED ORDER — FLUTICASONE PROPIONATE 50 MCG/ACT NA SUSP: 2.0000 | Freq: Every day | NASAL | 5 refills | Status: DC

## 2017-05-20 MED ORDER — FLUTICASONE PROPIONATE 50 MCG/ACT NA SUSP: NASAL | 5 refills | Status: DC

## 2017-05-20 MED ORDER — MONTELUKAST SODIUM 10 MG PO TABS: 10.0000 mg | ORAL_TABLET | Freq: Every day | ORAL | 5 refills | Status: DC

## 2017-05-20 MED ORDER — ALBUTEROL SULFATE HFA 108 (90 BASE) MCG/ACT IN AERS: 2.0000 | INHALATION_SPRAY | RESPIRATORY_TRACT | 1 refills | Status: DC | PRN

## 2017-05-20 MED ORDER — MONTELUKAST SODIUM 10 MG PO TABS: ORAL_TABLET | ORAL | 5 refills | Status: DC

## 2017-05-20 MED ORDER — ALBUTEROL SULFATE HFA 108 (90 BASE) MCG/ACT IN AERS: 2.0000 | INHALATION_SPRAY | RESPIRATORY_TRACT | 0 refills | Status: DC | PRN

## 2017-05-20 NOTE — Patient Instructions (Addendum)
Zyrtec 10 mg once a day if needed for runny nose Fluticasone 2 sprays per nostril once a day if needed for stuffy nose Continue to use NeilMed saline rinse daily before medicated nasal spray Ventolin 2 puffs every 4 hours if needed for wheezing or coughing spells Start on montelukast 10 mg once a day for coughing or wheezing Get a flu shot Continue other medications as prescribed Call me if he's not doing well on this treatment plan Follow up in 6 months or sooner if needed

## 2017-05-20 NOTE — Progress Notes (Signed)
[address] [place] [address] Dept: [phone]  FOLLOW UP NOTE  Patient ID: Isaiah OverlandRick Sellers, male    DOB: February 18, 1961  Age: 56 y.o. MRN: [id number] Date of Office Visit: 05/20/2017  Assessment  Chief Complaint: Asthma  HPI Isaiah OverlandRick Sellers presents for follow-up of asthma and allergic rhinitis. He was last seen in this office on 11/18/2016 by Dr. Beaulah DinningBardelas. At that time his asthma was well controlled using only the Ventolin inhaler as needed and he had not started on the montelukast. He did report some nasal congestion and began using Flonase nasal spray and saline nasal wash with success. His blood pressure has been well controlled  At today's visit, he reports his asthma has been well controlled. He reports no shortness of breath, no nighttime awakenings, no wheezing, no activity limitation due to shortness of breath or wheezing. He has not gone to the emergency Department or urgent care or needed any steroids for asthma since the last visit. He does report an increase in coughing and he is using the Ventolin inhaler 4-6 times a week beginning one week ago. The cough is nonproductive. 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 He is not currently taking montelukast.  Current medications will be continued and revised in the  after visit summary   Drug Allergies:  No Known Allergies  Physical Exam: BP 124/80   Pulse 90   Temp 97.9 F (36.6 C) (Oral)   Resp 20   SpO2 97%    Physical Exam  Constitutional: He is oriented to person, place, and time. He appears well-developed and well-nourished.  HENT:  Eyes normal. Ears normal. Bilateral nares slightly erythematous and edematous  Eyes: Conjunctivae are normal.  Neck: Normal range of motion. Neck supple.  Cardiovascular: Normal heart sounds.   S1-S2 normal. Regular heart rate and rhythm  Pulmonary/Chest: Effort normal.  Lungs clear to auscultation  Musculoskeletal: Normal range of motion.   Neurological: He is alert and oriented to person, place, and time.  Skin: Skin is warm and dry.  Psychiatric: He has a normal mood and affect. His behavior is normal. Judgment and thought content normal.    Diagnostics: FEV1 3.60, FVC 4.71. Predicted FEV1 report 47, predicted FEV4.40. Spirometry is in the normal range.    Assessment and Plan: 1. Mild persistent asthma without complication   2. Other allergic rhinitis   3. Essential hypertension     Meds ordered this encounter  Medications  . DISCONTD: albuterol (VENTOLIN HFA) 108 (90 Base) MCG/ACT inhaler    Sig: Inhale 2 puffs into the lungs every 4 (four) hours as needed for wheezing or shortness of breath.    Dispense:  1 Inhaler    Refill:  0  . montelukast (SINGULAIR) 10 MG tablet    Sig: Take 1 tablet (10 mg total) by mouth at bedtime.    Dispense:  30 tablet    Refill:  5  . DISCONTD: fluticasone (FLONASE) 50 MCG/ACT nasal spray    Sig: Place 2 sprays into both nostrils daily.    Dispense:  16 g    Refill:  5    Patient will call  . fluticasone (FLONASE) 50 MCG/ACT nasal spray    Sig: 2 sprays per nostril once a day if needed for stuffy nose.    Dispense:  16 g    Refill:  5    Patient will call  . montelukast (SINGULAIR) 10 MG tablet  Sig: Take 1 tablet once a day for coughing or wheezing.    Dispense:  34 tablet    Refill:  5  . albuterol (VENTOLIN HFA) 108 (90 Base) MCG/ACT inhaler    Sig: Inhale 2 puffs into the lungs every 4 (four) hours as needed for wheezing or shortness of breath.    Dispense:  1 Inhaler    Refill:  1    Patient Instructions  Zyrtec 10 mg once a day if needed for runny nose Fluticasone 2 sprays per nostril once a day if needed for stuffy nose Continue to use NeilMed saline rinse daily before medicated nasal spray Ventolin 2 puffs every 4 hours if needed for wheezing or coughing spells Start on montelukast 10 mg once a day for coughing or wheezing Get a flu shot Continue other  medications as prescribed Call me if he's not doing well on this treatment plan Follow up in 6 months or sooner if needed   Follow up in 6 months, about 11/18/2017   Isaiah Sellers was seen by Dr. Beaulah Dinning in the clinic today.  Thank you for the opportunity to care for this patient.  Please do not hesitate to contact me with questions.  Tonette Bihari, M.D.  [facility] [address] [place], [place] [address] [phone]

## 2017-05-28 ENCOUNTER — Other Ambulatory Visit: Payer: Self-pay | Admitting: Allergy

## 2017-05-28 MED ORDER — FLUTICASONE PROPIONATE 50 MCG/ACT NA SUSP: NASAL | 5 refills | Status: DC

## 2017-09-01 ENCOUNTER — Telehealth: Payer: Self-pay | Admitting: Pediatrics

## 2017-09-01 ENCOUNTER — Ambulatory Visit: Payer: 59 | Admitting: Family Medicine

## 2017-09-01 ENCOUNTER — Encounter: Payer: Self-pay | Admitting: Family Medicine

## 2017-09-01 VITALS — BP 114/78 | HR 97 | Temp 98.0°F

## 2017-09-01 DIAGNOSIS — J3089 Other allergic rhinitis: Secondary | ICD-10-CM | POA: Diagnosis not present

## 2017-09-01 DIAGNOSIS — J0100 Acute maxillary sinusitis, unspecified: Secondary | ICD-10-CM

## 2017-09-01 DIAGNOSIS — J4530 Mild persistent asthma, uncomplicated: Secondary | ICD-10-CM

## 2017-09-01 DIAGNOSIS — I10 Essential (primary) hypertension: Secondary | ICD-10-CM | POA: Diagnosis not present

## 2017-09-01 MED ORDER — FLUTICASONE PROPIONATE 50 MCG/ACT NA SUSP: 2.0000 | Freq: Every day | NASAL | 5 refills | Status: DC

## 2017-09-01 NOTE — Progress Notes (Addendum)
[address] [place] [address] Dept: [phone]  FOLLOW UP NOTE  Patient ID: Isaiah Sellers, male    DOB: 1960/12/16  Age: 57 y.o. MRN: [id number] Date of Office Visit: 09/01/2017  Assessment  Chief Complaint: Nasal Congestion and Sinusitis  HPI Isaiah Sellers is a 57 year old male who presents to the clinic today for a sick visit. He was last seen in the clinic on 05/20/2017 by Dr. Beaulah Dinning for evaluation of asthma and allergic rhinitis. At that time, his asthma was reported as well controlled with montelukast 10 mg and Ventolin HFA as needed. He reported nasal congestion and was started on fluticasone nasal spray.  At today's visit, he reports nasal congestion, thick post nasal drip in his throat, non-productive cough, and sinus headache that began on Thursday morning (5 days ago) while he was at work. He has been using nasal saline rinses daily, cetirizine 10 mg daily, and fluticasone nasal spray 2 sprays in each nostril once a day.   Isaiah's asthma has been well controlled. He has not required rescue medication, experienced nocturnal awakenings due to lower respiratory symptoms, nor have activities of daily living been limited. He has required no Emergency Department or Urgent Care visits for his asthma. He has required zero courses of systemic steroids for asthma exacerbations since the last visit. ACT score today is 20, indicating excellent asthma symptom control. He continues to take montelukast 10 mg once a day and is using Ventolin inhaler one time a week.    Drug Allergies:  No Known Allergies  Physical Exam: BP 114/78   Pulse 97   Temp 98 F (36.7 C) (Oral)   SpO2 98%    Physical Exam  Constitutional: He is oriented to person, place, and time. He appears well-developed and well-nourished.  HENT:  Right Ear: External ear normal.  Left Ear: External ear normal.  Ears normal.  Eyes normal.  Pharynx slightly erythematous.  Bilateral nares erythematous and  edematous with clear drainage noted.  Eyes: Conjunctivae are normal.  Neck: Normal range of motion. Neck supple.  Cardiovascular: Normal rate, regular rhythm and normal heart sounds.  S1-S2 normal.  Regular heart rate and rhythm.  No murmur noted.  Pulmonary/Chest: Effort normal and breath sounds normal.  Lungs clear to auscultation  Musculoskeletal: Normal range of motion.  Neurological: He is alert and oriented to person, place, and time.  Skin: Skin is warm and dry.  Psychiatric: He has a normal mood and affect. His behavior is normal. Judgment and thought content normal.    Diagnostics: FVC 4.60, FEV1 3.84.  Predicted FVC 4.40.  Predicted FEV1 3.47.  Spirometry is within the normal range.  Assessment and Plan: 1. Acute maxillary sinusitis, recurrence not specified   2. Mild persistent asthma without complication   3. Other allergic rhinitis   4. Essential hypertension     Meds ordered this encounter  Medications  . fluticasone (FLONASE) 50 MCG/ACT nasal spray    Sig: Place 2 sprays into both nostrils daily.    Dispense:  1 g    Refill:  5    Patient Instructions  Zyrtec 10 mg once a day if needed for runny nose Continue to use NeilMed saline rinse daily before medicated nasal spray Fluticasone 2 sprays per nostril once a day if needed for stuffy nose Ventolin 2 puffs every 4 hours if needed for wheezing or coughing spells Start on montelukast 10 mg once a day for coughing or wheezing Prednisone 10 mg tablet.  For sinuses. Take 2 tablets for 4 days, then take 1 tablet for 1 day, then stop  Continue other medications as prescribed  Call me if he's not doing well on this treatment plan  Follow up in 6 months or sooner if needed   Return in about 6 months (around 03/01/2018), or if symptoms worsen or fail to improve.     Thank you for the opportunity to care for this patient.  Please do not hesitate to contact me with questions.  Thermon LeylandAnne Rayan Ines, FNP [facility]  Isaiah Sellers was seen in the clinic with Dr. Beaulah DinningBardelas today.   I have provided oversight concerning Thermon Leylandnne Kyesha Balla' evaluation and treatment of this patient's health issues addressed during today's encounter. I agree with the assessment and therapeutic plan as outlined in the note.  Thank you for the opportunity to care for this patient.  Please do not hesitate to contact me with questions.  Tonette BihariJ. A. Bardelas, M.D.  [facility] [address] [place], [place] [address] [phone]

## 2017-09-01 NOTE — Telephone Encounter (Signed)
Will send receipt after we apply his copay to today's visit. - kt

## 2017-09-01 NOTE — Patient Instructions (Signed)
Zyrtec 10 mg once a day if needed for runny nose Continue to use NeilMed saline rinse daily before medicated nasal spray Fluticasone 2 sprays per nostril once a day if needed for stuffy nose Ventolin 2 puffs every 4 hours if needed for wheezing or coughing spells Start on montelukast 10 mg once a day for coughing or wheezing Prednisone 10 mg tablet. For sinuses. Take 2 tablets for 4 days, then take 1 tablet for 1 day, then stop  Continue other medications as prescribed  Call me if he's not doing well on this treatment plan  Follow up in 6 months or sooner if needed

## 2017-09-01 NOTE — Telephone Encounter (Signed)
Please send itemized receipt of today's payment of $110.00 . Thanks

## 2017-11-18 ENCOUNTER — Ambulatory Visit: Payer: 59 | Admitting: Pediatrics

## 2017-11-26 ENCOUNTER — Other Ambulatory Visit: Payer: Self-pay

## 2017-11-26 MED ORDER — ALBUTEROL SULFATE HFA 108 (90 BASE) MCG/ACT IN AERS: 2.0000 | INHALATION_SPRAY | RESPIRATORY_TRACT | 1 refills | Status: DC | PRN

## 2017-11-26 NOTE — Telephone Encounter (Signed)
RF on Ventolin HFA x 1 with 1 refill at [facility]

## 2017-12-02 ENCOUNTER — Ambulatory Visit: Payer: 59 | Admitting: Family Medicine

## 2017-12-02 DIAGNOSIS — J309 Allergic rhinitis, unspecified: Secondary | ICD-10-CM

## 2017-12-03 ENCOUNTER — Encounter: Payer: Self-pay | Admitting: Allergy and Immunology

## 2017-12-03 ENCOUNTER — Ambulatory Visit: Payer: 59 | Admitting: Allergy and Immunology

## 2017-12-03 VITALS — BP 130/82 | HR 92 | Temp 98.1°F | Resp 16

## 2017-12-03 DIAGNOSIS — J3089 Other allergic rhinitis: Secondary | ICD-10-CM | POA: Diagnosis not present

## 2017-12-03 DIAGNOSIS — J0100 Acute maxillary sinusitis, unspecified: Secondary | ICD-10-CM | POA: Diagnosis not present

## 2017-12-03 DIAGNOSIS — J45901 Unspecified asthma with (acute) exacerbation: Secondary | ICD-10-CM

## 2017-12-03 MED ORDER — AZELASTINE HCL 0.1 % NA SOLN: NASAL | 5 refills | Status: DC

## 2017-12-03 MED ORDER — METHYLPREDNISOLONE ACETATE 80 MG/ML IJ SUSP
80.0000 mg | Freq: Once | INTRAMUSCULAR | Status: AC
  Administered 2017-12-03: 80 mg via INTRAMUSCULAR

## 2017-12-03 MED ORDER — FLUTICASONE PROPIONATE HFA 220 MCG/ACT IN AERO: 2.0000 | INHALATION_SPRAY | Freq: Two times a day (BID) | RESPIRATORY_TRACT | 5 refills | Status: DC

## 2017-12-03 NOTE — Assessment & Plan Note (Signed)
   Continue appropriate allergen avoidance measures and fluticasone nasal spray as needed.  Azelastine nasal spray has been prescribed and guaifenesin and nasal saline lavage has been recommended (as above). 

## 2017-12-03 NOTE — Progress Notes (Signed)
Follow-up Note  RE: Isaiah Sellers MRN: [id number] DOB: 10-16-1960 Date of Office Visit: 12/03/2017  Primary care provider: Andreas Blower., MD Referring provider: Andreas Blower., MD  History of present illness: Isaiah Sellers is a 57 y.o. male with persistent asthma allergic rhinitis, and hypertension presented today for sick visit.  He was last seen in this clinic on September 01, 2017 by Thermon Leyland, NP.  He reports that despite compliance with montelukast 10 mg daily, he has been requiring albuterol rescue 2-3 times per day over the past 3 weeks.  In addition, his sleep has been disrupted at night because of frequent coughing, wheezing, and chest tightness.  He believes that this asthma exacerbation has been triggered by the high pollen counts.  He has also been experiencing nasal congestion, postnasal drainage, and sinus pressure despite taking cetirizine daily.  He denies fevers, chills, and discolored mucus production.  He admits that he has not been using fluticasone nasal spray because it seems to irritate his nose and cause sneezing.  Assessment and plan: Asthma with acute exacerbation  Depo-Medrol 80 mg was administered in the office.  Prednisone has been provided and is to be started tomorrow as follows: 20 mg daily x 4 days, 10 mg x1 day, then stop.  For now, and during respiratory tract infections or asthma flares, add Flovent 220g 2 inhalations 2 times per day until symptoms have returned to baseline.   To maximize pulmonary deposition, a spacer has been provided along with instructions for its proper administration with an HFA inhaler.  The patient has been asked to contact me if his symptoms persist or progress. Otherwise, he may return for follow up in 4 months.  Acute maxillary sinusitis  Depo-Medrol and prednisone have been provided (as above).  A prescription has been provided for azelastine nasal spray, 1-2 sprays per nostril 2 times daily. Proper nasal spray  technique has been discussed and demonstrated.   Nasal saline lavage (NeilMed) as needed has been recommended prior to medicated nasal sprays and as needed along with instructions for proper administration.  For thick post nasal drainage, add guaifenesin 1200 mg (Mucinex Maximum Strength)  twice daily as needed with adequate hydration as discussed.  The patient has been asked to contact me if his symptoms persist, progress, or if he becomes febrile.  Other allergic rhinitis  Continue appropriate allergen avoidance measures and fluticasone nasal spray as needed.  Azelastine nasal spray has been prescribed and guaifenesin and nasal saline lavage has been recommended (as above).   Meds ordered this encounter  Medications  . fluticasone (FLOVENT HFA) 220 MCG/ACT inhaler    Sig: Inhale 2 puffs into the lungs 2 (two) times daily.    Dispense:  1 Inhaler    Refill:  5  . azelastine (ASTELIN) 0.1 % nasal spray    Sig: 1-2 sprays per nostril 2 times daily as needed    Dispense:  30 mL    Refill:  5  . methylPREDNISolone acetate (DEPO-MEDROL) injection 80 mg    Diagnostics: Spirometry reveals an FVC of 4.83 L and an FEV1 of 3.67 L without significant post bronchodilator improvement.  Please see scanned spirometry results for details.    Physical examination: Blood pressure 130/82, pulse 92, temperature 98.1 F (36.7 C), temperature source Oral, resp. rate 16, SpO2 95 %.  General: Alert, interactive, in no acute distress. HEENT: TMs pearly gray, turbinates edematous with thick discharge, post-pharynx erythematous. Neck: Supple without lymphadenopathy. Lungs: Clear to auscultation  without wheezing, rhonchi or rales. CV: Normal S1, S2 without murmurs. Skin: Warm and dry, without lesions or rashes.  The following portions of the patient's history were reviewed and updated as appropriate: allergies, current medications, past family history, past medical history, past social history, past  surgical history and problem list.  Allergies as of 12/03/2017   No Known Allergies     Medication List        Accurate as of 12/03/17  3:39 PM. Always use your most recent med list.          albuterol (2.5 MG/3ML) 0.083% nebulizer solution Commonly known as:  PROVENTIL Take 2.5 mg by nebulization every 6 (six) hours as needed.   albuterol 108 (90 Base) MCG/ACT inhaler Commonly known as:  VENTOLIN HFA Inhale 2 puffs into the lungs every 4 (four) hours as needed for wheezing or shortness of breath.   amLODipine 10 MG tablet Commonly known as:  NORVASC TK 1 T PO QD   azelastine 0.1 % nasal spray Commonly known as:  ASTELIN 1-2 sprays per nostril 2 times daily as needed   cetirizine 10 MG tablet Commonly known as:  ZYRTEC Take by mouth.   clindamycin 1 % lotion Commonly known as:  CLEOCIN T APP TO ACNE PRONE AREAS ON FACE BID FOR FLARES   DAILY MULTIVITAMIN PO Take by mouth.   doxycycline 100 MG tablet Commonly known as:  VIBRA-TABS Take 100 mg by mouth daily.   fluocinonide 0.05 % external solution Commonly known as:  LIDEX APPLY EXTERNALLY TO THE AFFECTED AREA ON SCALP EVERY DAY AS NEEDED   fluticasone 220 MCG/ACT inhaler Commonly known as:  FLOVENT HFA Inhale 2 puffs into the lungs 2 (two) times daily.   fluticasone 50 MCG/ACT nasal spray Commonly known as:  FLONASE 2 sprays per nostril once a day if needed for stuffy nose.   losartan 50 MG tablet Commonly known as:  COZAAR Take by mouth.   montelukast 10 MG tablet Commonly known as:  SINGULAIR Take 1 tablet (10 mg total) by mouth at bedtime.   VITAMIN D PO Take 1 tablet by mouth daily.       No Known Allergies  Review of systems: Review of systems negative except as noted in HPI / PMHx or noted below: Constitutional: Negative.  HENT: Negative.   Eyes: Negative.  Respiratory: Negative.   Cardiovascular: Negative.  Gastrointestinal: Negative.  Genitourinary: Negative.  Musculoskeletal:  Negative.  Neurological: Negative.  Endo/Heme/Allergies: Negative.  Cutaneous: Negative.  Past Medical History:  Diagnosis Date  . Asthma   . Hypertension     Family History  Problem Relation Age of Onset  . Angioedema Neg Hx   . Allergic rhinitis Neg Hx   . Asthma Neg Hx   . Immunodeficiency Neg Hx   . Eczema Neg Hx   . Urticaria Neg Hx     Social History   Socioeconomic History  . Marital status: Married    Spouse name: Not on file  . Number of children: Not on file  . Years of education: Not on file  . Highest education level: Not on file  Occupational History  . Not on file  Social Needs  . Financial resource strain: Not on file  . Food insecurity:    Worry: Not on file    Inability: Not on file  . Transportation needs:    Medical: Not on file    Non-medical: Not on file  Tobacco Use  . Smoking status: Never  Smoker  . Smokeless tobacco: Never Used  Substance and Sexual Activity  . Alcohol use: Yes    Alcohol/week: 1.2 oz    Types: 2 Cans of beer per week    Comment: socially  . Drug use: No  . Sexual activity: Yes    Birth control/protection: Condom  Lifestyle  . Physical activity:    Days per week: Not on file    Minutes per session: Not on file  . Stress: Not on file  Relationships  . Social connections:    Talks on phone: Not on file    Gets together: Not on file    Attends religious service: Not on file    Active member of club or organization: Not on file    Attends meetings of clubs or organizations: Not on file    Relationship status: Not on file  . Intimate partner violence:    Fear of current or ex partner: Not on file    Emotionally abused: Not on file    Physically abused: Not on file    Forced sexual activity: Not on file  Other Topics Concern  . Not on file  Social History Narrative  . Not on file     I appreciate the opportunity to take part in Rick's care. Please do not hesitate to contact me with  questions.  Sincerely,   R. Jorene Guest, MD

## 2017-12-03 NOTE — Patient Instructions (Addendum)
Asthma with acute exacerbation  Depo-Medrol 80 mg was administered in the office.  Prednisone has been provided and is to be started tomorrow as follows: 20 mg daily x 4 days, 10 mg x1 day, then stop.  For now, and during respiratory tract infections or asthma flares, add Flovent 220g 2 inhalations 2 times per day until symptoms have returned to baseline.   To maximize pulmonary deposition, a spacer has been provided along with instructions for its proper administration with an HFA inhaler.  The patient has been asked to contact me if his symptoms persist or progress. Otherwise, he may return for follow up in 4 months.  Acute maxillary sinusitis  Depo-Medrol and prednisone have been provided (as above).  A prescription has been provided for azelastine nasal spray, 1-2 sprays per nostril 2 times daily. Proper nasal spray technique has been discussed and demonstrated.   Nasal saline lavage (NeilMed) as needed has been recommended prior to medicated nasal sprays and as needed along with instructions for proper administration.  For thick post nasal drainage, add guaifenesin 1200 mg (Mucinex Maximum Strength)  twice daily as needed with adequate hydration as discussed.  The patient has been asked to contact me if his symptoms persist, progress, or if he becomes febrile.  Other allergic rhinitis  Continue appropriate allergen avoidance measures and fluticasone nasal spray as needed.  Azelastine nasal spray has been prescribed and guaifenesin and nasal saline lavage has been recommended (as above).   Return in about 4 months (around 04/05/2018), or if symptoms worsen or fail to improve.

## 2017-12-03 NOTE — Progress Notes (Signed)
Depo.

## 2017-12-03 NOTE — Assessment & Plan Note (Signed)
   Depo-Medrol 80 mg was administered in the office.  Prednisone has been provided and is to be started tomorrow as follows: 20 mg daily x 4 days, 10 mg x1 day, then stop.  For now, and during respiratory tract infections or asthma flares, add Flovent 220g 2 inhalations 2 times per day until symptoms have returned to baseline.   To maximize pulmonary deposition, a spacer has been provided along with instructions for its proper administration with an HFA inhaler.  The patient has been asked to contact me if his symptoms persist or progress. Otherwise, he may return for follow up in 4 months.

## 2017-12-03 NOTE — Assessment & Plan Note (Signed)
   Depo-Medrol and prednisone have been provided (as above).  A prescription has been provided for azelastine nasal spray, 1-2 sprays per nostril 2 times daily. Proper nasal spray technique has been discussed and demonstrated.   Nasal saline lavage (NeilMed) as needed has been recommended prior to medicated nasal sprays and as needed along with instructions for proper administration.  For thick post nasal drainage, add guaifenesin 1200 mg (Mucinex Maximum Strength)  twice daily as needed with adequate hydration as discussed.  The patient has been asked to contact me if his symptoms persist, progress, or if he becomes febrile.

## 2018-02-18 ENCOUNTER — Encounter: Payer: Self-pay | Admitting: Allergy and Immunology

## 2018-02-18 ENCOUNTER — Ambulatory Visit: Payer: 59 | Admitting: Allergy and Immunology

## 2018-02-18 VITALS — BP 130/90 | HR 83 | Temp 98.1°F | Resp 16

## 2018-02-18 DIAGNOSIS — J4520 Mild intermittent asthma, uncomplicated: Secondary | ICD-10-CM

## 2018-02-18 DIAGNOSIS — J0100 Acute maxillary sinusitis, unspecified: Secondary | ICD-10-CM | POA: Diagnosis not present

## 2018-02-18 DIAGNOSIS — I10 Essential (primary) hypertension: Secondary | ICD-10-CM

## 2018-02-18 DIAGNOSIS — J3089 Other allergic rhinitis: Secondary | ICD-10-CM

## 2018-02-18 MED ORDER — PREDNISONE 1 MG PO TABS: 10.0000 mg | ORAL_TABLET | Freq: Every day | ORAL | Status: DC

## 2018-02-18 MED ORDER — BUDESONIDE 0.5 MG/2ML IN SUSP: 0.5000 mg | Freq: Two times a day (BID) | RESPIRATORY_TRACT | 5 refills | Status: DC

## 2018-02-18 NOTE — Assessment & Plan Note (Signed)
   Continue albuterol HFA, 1-2 inhalations every 4-6 hours as needed.  Subjective and objective measures of pulmonary function will be followed and the treatment plan will be adjusted accordingly. 

## 2018-02-18 NOTE — Progress Notes (Signed)
Follow-up Note  RE: Isaiah OverlandRick Sellers MRN: [id number] DOB: [birth year] Date of Office Visit: 02/18/2018  Primary care provider: Andreas Blowerabeza, Yuri M., MD Referring provider: Andreas Blowerabeza, Yuri M., MD  History of present illness: Isaiah OverlandRick Sellers is a 57 y.o. male with persistent asthma, allergic rhinitis, and hypertension presenting today for sick visit.  He was last seen in this clinic on Dec 03, 2017.  He reports that over the past 2 weeks he has experienced nasal congestion and sinus pressure over his cheekbones.  He believes that the symptoms are triggered by rapid weather changes.  He denies fevers, chills, and discolored mucus production.  He has been compliant with fluticasone nasal spray, azelastine nasal spray, and nasal saline irrigation.  He reports that his asthma has been well controlled.  He rarely requires albuterol rescue, does not experience limitations in normal daily activities, and does not experience nocturnal awakenings due to lower respiratory symptoms.  Assessment and plan: Acute maxillary sinusitis  Prednisone has been provided, 40 mg x3 days, 20 mg x1 day, 10 mg x1 day, then stop.  Start budesonide/saline nasal irrigation twice a day.  A prescription has been provided for budesonide 0.5 mg respules and instructions for mixing and adminstering the rinse have been discussed and provided in written form.  For now, continue azelastine nasal spray, 2 sprays per nostril 2 times daily.  For thick post nasal drainage, add guaifenesin 1200 mg (Mucinex Maximum Strength)  twice daily as needed with adequate hydration as discussed.  Referral has been made to Dr. Suszanne Connerseoh, otolaryngology.  The patient has been asked to contact me if his symptoms persist, progress, or if he becomes febrile.  Mild intermittent asthma  Continue albuterol HFA, 1-2 inhalations every 4-6 hours as needed.  Subjective and objective measures of pulmonary function will be followed and the treatment plan will be  adjusted accordingly.  Other allergic rhinitis  Continue appropriate allergen avoidance measures.  Treatment plan as outlined above for acute sinusitis.  If ENT evaluation reveals no anatomic abnormalities, we will retest aeroallergens.  Essential hypertension  The patient has been made aware of the elevated blood pressure reading and has been encouraged to follow up with his primary care physician in the near future regarding this issue.  Isaiah NobleRick has verbalized understanding and agreed to do so.   Meds ordered this encounter  Medications  . budesonide (PULMICORT) 0.5 MG/2ML nebulizer solution    Sig: Take 2 mLs (0.5 mg total) by nebulization 2 (two) times daily.    Dispense:  120 mL    Refill:  5  . predniSONE (DELTASONE) tablet 10 mg    Diagnostics: Spirometry:  Normal with an FEV1 of 102% predicted.  Please see scanned spirometry results for details.    Physical examination: Blood pressure 130/90, pulse 83, temperature 98.1 F (36.7 C), temperature source Oral, resp. rate 16, SpO2 99 %.  General: Alert, interactive, in no acute distress. HEENT: TMs pearly gray, turbinates edematous with thick discharge, post-pharynx erythematous. Neck: Supple without lymphadenopathy. Lungs: Clear to auscultation without wheezing, rhonchi or rales. CV: Normal S1, S2 without murmurs. Skin: Warm and dry, without lesions or rashes.  The following portions of the patient's history were reviewed and updated as appropriate: allergies, current medications, past family history, past medical history, past social history, past surgical history and problem list.  Allergies as of 02/18/2018      Reactions   Chocolate Hives      Medication List  Accurate as of 02/18/18  6:10 PM. Always use your most recent med list.          albuterol (2.5 MG/3ML) 0.083% nebulizer solution Commonly known as:  PROVENTIL Take 2.5 mg by nebulization every 6 (six) hours as needed.   albuterol 108 (90 Base)  MCG/ACT inhaler Commonly known as:  VENTOLIN HFA Inhale 2 puffs into the lungs every 4 (four) hours as needed for wheezing or shortness of breath.   amLODipine 10 MG tablet Commonly known as:  NORVASC TK 1 T PO QD   azelastine 0.1 % nasal spray Commonly known as:  ASTELIN 1-2 sprays per nostril 2 times daily as needed   budesonide 0.5 MG/2ML nebulizer solution Commonly known as:  PULMICORT Take 2 mLs (0.5 mg total) by nebulization 2 (two) times daily.   cetirizine 10 MG tablet Commonly known as:  ZYRTEC Take by mouth.   clindamycin 1 % lotion Commonly known as:  CLEOCIN T APP TO ACNE PRONE AREAS ON FACE BID FOR FLARES   DAILY MULTIVITAMIN PO Take by mouth.   doxycycline 100 MG capsule Commonly known as:  VIBRAMYCIN TK 1 C PO BID   fluocinonide 0.05 % external solution Commonly known as:  LIDEX APPLY EXTERNALLY TO THE AFFECTED AREA ON SCALP EVERY DAY AS NEEDED   fluticasone 220 MCG/ACT inhaler Commonly known as:  FLOVENT HFA Inhale 2 puffs into the lungs 2 (two) times daily.   fluticasone 50 MCG/ACT nasal spray Commonly known as:  FLONASE 2 sprays per nostril once a day if needed for stuffy nose.   losartan 50 MG tablet Commonly known as:  COZAAR Take by mouth.   montelukast 10 MG tablet Commonly known as:  SINGULAIR Take 1 tablet (10 mg total) by mouth at bedtime.   VITAMIN D PO Take 1 tablet by mouth daily.       Allergies  Allergen Reactions  . Chocolate Hives   Review of systems: Review of systems negative except as noted in HPI / PMHx or noted below: Constitutional: Negative.  HENT: Negative.   Eyes: Negative.  Respiratory: Negative.   Cardiovascular: Negative.  Gastrointestinal: Negative.  Genitourinary: Negative.  Musculoskeletal: Negative.  Neurological: Negative.  Endo/Heme/Allergies: Negative.  Cutaneous: Negative.  Past Medical History:  Diagnosis Date  . Asthma   . Hypertension     Family History  Problem Relation Age of  Onset  . Angioedema Neg Hx   . Allergic rhinitis Neg Hx   . Asthma Neg Hx   . Immunodeficiency Neg Hx   . Eczema Neg Hx   . Urticaria Neg Hx     Social History   Socioeconomic History  . Marital status: Married    Spouse name: Not on file  . Number of children: Not on file  . Years of education: Not on file  . Highest education level: Not on file  Occupational History  . Not on file  Social Needs  . Financial resource strain: Not on file  . Food insecurity:    Worry: Not on file    Inability: Not on file  . Transportation needs:    Medical: Not on file    Non-medical: Not on file  Tobacco Use  . Smoking status: Never Smoker  . Smokeless tobacco: Never Used  Substance and Sexual Activity  . Alcohol use: Yes    Alcohol/week: 1.2 oz    Types: 2 Cans of beer per week    Comment: socially  . Drug use: No  .  Sexual activity: Yes    Birth control/protection: Condom  Lifestyle  . Physical activity:    Days per week: Not on file    Minutes per session: Not on file  . Stress: Not on file  Relationships  . Social connections:    Talks on phone: Not on file    Gets together: Not on file    Attends religious service: Not on file    Active member of club or organization: Not on file    Attends meetings of clubs or organizations: Not on file    Relationship status: Not on file  . Intimate partner violence:    Fear of current or ex partner: Not on file    Emotionally abused: Not on file    Physically abused: Not on file    Forced sexual activity: Not on file  Other Topics Concern  . Not on file  Social History Narrative  . Not on file    I appreciate the opportunity to take part in Isaiah's care. Please do not hesitate to contact me with questions.  Sincerely,   R. Jorene Guest, MD

## 2018-02-18 NOTE — Assessment & Plan Note (Signed)
   Continue appropriate allergen avoidance measures.  Treatment plan as outlined above for acute sinusitis.  If ENT evaluation reveals no anatomic abnormalities, we will retest aeroallergens.

## 2018-02-18 NOTE — Assessment & Plan Note (Signed)
   The patient has been made aware of the elevated blood pressure reading and has been encouraged to follow up with his primary care physician in the near future regarding this issue.  Isaiah Sellers has verbalized understanding and agreed to do so.

## 2018-02-18 NOTE — Assessment & Plan Note (Addendum)
   Prednisone has been provided, 40 mg x3 days, 20 mg x1 day, 10 mg x1 day, then stop.  Start budesonide/saline nasal irrigation twice a day.  A prescription has been provided for budesonide 0.5 mg respules and instructions for mixing and adminstering the rinse have been discussed and provided in written form.  For now, continue azelastine nasal spray, 2 sprays per nostril 2 times daily.  For thick post nasal drainage, add guaifenesin 1200 mg (Mucinex Maximum Strength)  twice daily as needed with adequate hydration as discussed.  Referral has been made to Dr. Suszanne Connerseoh, otolaryngology.  The patient has been asked to contact me if his symptoms persist, progress, or if he becomes febrile.

## 2018-02-18 NOTE — Patient Instructions (Addendum)
Acute maxillary sinusitis  Prednisone has been provided, 40 mg x3 days, 20 mg x1 day, 10 mg x1 day, then stop.  Start budesonide/saline nasal irrigation twice a day.  A prescription has been provided for budesonide 0.5 mg respules and instructions for mixing and adminstering the rinse have been discussed and provided in written form.  For now, continue azelastine nasal spray, 2 sprays per nostril 2 times daily.  For thick post nasal drainage, add guaifenesin 1200 mg (Mucinex Maximum Strength)  twice daily as needed with adequate hydration as discussed.  Referral has been made to Dr. Suszanne Connerseoh, otolaryngology.  The patient has been asked to contact me if his symptoms persist, progress, or if he becomes febrile.  Mild intermittent asthma  Continue albuterol HFA, 1-2 inhalations every 4-6 hours as needed.  Subjective and objective measures of pulmonary function will be followed and the treatment plan will be adjusted accordingly.  Other allergic rhinitis  Continue appropriate allergen avoidance measures.  Treatment plan as outlined above for acute sinusitis.  If ENT evaluation reveals no anatomic abnormalities, we will retest aeroallergens.  Essential hypertension  The patient has been made aware of the elevated blood pressure reading and has been encouraged to follow up with his primary care physician in the near future regarding this issue.  Raiford NobleRick has verbalized understanding and agreed to do so.   Return in about 6 months (around 08/21/2018), or if symptoms worsen or fail to improve.   Budesonide (Pulmicort) + Saline Irrigation/Rinse  Budesonide (Pulmicort) is an anti-inflammatory steroid medication used to decrease nasal and sinus inflammation. It is dispensed in liquid form in a vial. Although it is manufactured for use with a nebulizer, we intend for you to use it with the NeilMed Sinus Rinse bottle (preferred) or a Neti pot.   Instructions:  1) Make 240cc of saline in the  NeilMed bottle using the salt packets or your own saline recipe (see separate handout).  2) Add the entire 2cc vial of liquid Budesonide (Pulmicort) to the rinse bottle and mix together.  3) While in the shower or over the sink, tilt your head forward to a comfortable level. Put the tip of the sinus rinse bottle in your nostril and aim it towards the crown or top of your head. Gently squeeze the bottle to flush out your nose. The fluid will circulate in and out of your sinus cavities, coming back out from either nostril or through your mouth. Try not to swallow large quantities and spit it out instead.  4) Perform Budesonide (Pulmicort) + Saline irrigations 2 times daily.

## 2018-03-03 ENCOUNTER — Ambulatory Visit: Payer: 59 | Admitting: Pediatrics

## 2018-03-17 ENCOUNTER — Telehealth: Payer: Self-pay

## 2018-03-17 NOTE — Telephone Encounter (Signed)
He can use Nasacort AQ, 2 sp per nostril daily as needed.  Nasal saline spray (i.e., Simply Saline) or nasal saline lavage (i.e., NeilMed) is recommended as needed and prior to medicated nasal sprays.

## 2018-03-17 NOTE — Telephone Encounter (Signed)
Left detailed message per patients request on voice mail regarding Dr. Maren ReamerBobbitts response.  Also patient aware the referral coordinator will be calling with information on ENT referral.

## 2018-03-17 NOTE — Telephone Encounter (Signed)
Patients want to contact referral coordinator and Dr. Nunzio CobbsBobbitt.  DEE:  Patient returning Dee's call to schedule ENT referral to Dr. Suszanne Connerseoh. Please call patient at [phone].   DR BOBBITT:   Patient states budesonide in nasal rinse burns very bad and he cannot use this.  Is there an OTC medication Dr. Nunzio CobbsBobbitt recommends for nasal congestion.  Please advise.

## 2018-03-20 NOTE — Telephone Encounter (Signed)
Patient is scheduled to see Dr Suszanne Connersteoh on Oct 1 @ 2:30. Their office has tried to contact the patient multiple times. I left a voicemail for the patient with this information. If he calls back here is the address: [address] [place] [id number] [phone]./

## 2018-04-06 ENCOUNTER — Ambulatory Visit: Payer: 59 | Admitting: Pediatrics

## 2018-05-05 ENCOUNTER — Other Ambulatory Visit: Payer: Self-pay | Admitting: Otolaryngology

## 2018-05-05 ENCOUNTER — Other Ambulatory Visit (INDEPENDENT_AMBULATORY_CARE_PROVIDER_SITE_OTHER): Payer: Self-pay | Admitting: Otolaryngology

## 2018-05-05 DIAGNOSIS — J328 Other chronic sinusitis: Secondary | ICD-10-CM

## 2018-05-11 ENCOUNTER — Ambulatory Visit
Admission: RE | Admit: 2018-05-11 | Discharge: 2018-05-11 | Disposition: A | Discharge status: Home / Self Care | Payer: 59 | Source: Ambulatory Visit | Attending: Otolaryngology | Admitting: Otolaryngology

## 2018-05-11 DIAGNOSIS — J328 Other chronic sinusitis: Secondary | ICD-10-CM

## 2018-06-29 ENCOUNTER — Other Ambulatory Visit: Payer: Self-pay | Admitting: Allergy

## 2018-06-29 MED ORDER — ALBUTEROL SULFATE HFA 108 (90 BASE) MCG/ACT IN AERS: 2.0000 | INHALATION_SPRAY | RESPIRATORY_TRACT | 1 refills | Status: DC | PRN

## 2018-06-30 ENCOUNTER — Other Ambulatory Visit: Payer: Self-pay | Admitting: Pediatrics

## 2018-06-30 NOTE — Telephone Encounter (Signed)
Spoke with pt, advised that ventolin was sent in yesterday to pharmacy. He states it is $50 when it is usually $35, informed him that the pharmacy should be able run the cheapest albuterol inhaler to see which is more affordable with his insurance. He will call back if he has a problem.

## 2018-06-30 NOTE — Telephone Encounter (Signed)
Patient called back questioning the price of the ventolyn and why it is costing more than it has before. Patient was told to ask the pharmacy about price changes

## 2018-06-30 NOTE — Telephone Encounter (Signed)
Patient is calling questioning why albuterol was sent in instead of the ventolyn. Patient would like ventolyn sent to [facility] on [address]

## 2018-06-30 NOTE — Telephone Encounter (Signed)
Lm for pt to call us back please explain that ventolin is the brand of inhaler the medicine is albuterol

## 2018-07-06 ENCOUNTER — Telehealth: Payer: Self-pay | Admitting: Allergy and Immunology

## 2018-07-06 ENCOUNTER — Other Ambulatory Visit: Payer: Self-pay

## 2018-07-06 NOTE — Telephone Encounter (Signed)
Lm for pt to call us back  

## 2018-07-06 NOTE — Telephone Encounter (Signed)
Patient called in requesting pharmacy to authorize generic medication to ventolin. Please call patient back to confirm.

## 2018-07-07 ENCOUNTER — Other Ambulatory Visit: Payer: Self-pay

## 2018-07-07 NOTE — Telephone Encounter (Signed)
Pt had it already taken time I had spoke with him

## 2018-08-05 ENCOUNTER — Other Ambulatory Visit: Payer: Self-pay | Admitting: Otolaryngology

## 2018-08-12 ENCOUNTER — Encounter (HOSPITAL_BASED_OUTPATIENT_CLINIC_OR_DEPARTMENT_OTHER): Payer: Self-pay | Admitting: *Deleted

## 2018-08-12 ENCOUNTER — Other Ambulatory Visit: Payer: Self-pay

## 2018-08-14 ENCOUNTER — Encounter (HOSPITAL_BASED_OUTPATIENT_CLINIC_OR_DEPARTMENT_OTHER)
Admission: RE | Admit: 2018-08-14 | Discharge: 2018-08-14 | Disposition: A | Discharge status: Home / Self Care | Payer: 59 | Source: Ambulatory Visit | Attending: Otolaryngology | Admitting: Otolaryngology

## 2018-08-14 DIAGNOSIS — J3489 Other specified disorders of nose and nasal sinuses: Secondary | ICD-10-CM | POA: Diagnosis not present

## 2018-08-14 DIAGNOSIS — J342 Deviated nasal septum: Secondary | ICD-10-CM | POA: Diagnosis not present

## 2018-08-14 DIAGNOSIS — I10 Essential (primary) hypertension: Secondary | ICD-10-CM | POA: Diagnosis not present

## 2018-08-14 DIAGNOSIS — J338 Other polyp of sinus: Secondary | ICD-10-CM | POA: Diagnosis present

## 2018-08-14 DIAGNOSIS — J343 Hypertrophy of nasal turbinates: Secondary | ICD-10-CM | POA: Diagnosis not present

## 2018-08-14 DIAGNOSIS — J45909 Unspecified asthma, uncomplicated: Secondary | ICD-10-CM | POA: Diagnosis not present

## 2018-08-14 DIAGNOSIS — Z79899 Other long term (current) drug therapy: Secondary | ICD-10-CM | POA: Diagnosis not present

## 2018-08-14 DIAGNOSIS — J328 Other chronic sinusitis: Secondary | ICD-10-CM | POA: Diagnosis not present

## 2018-08-18 ENCOUNTER — Encounter (HOSPITAL_BASED_OUTPATIENT_CLINIC_OR_DEPARTMENT_OTHER): Payer: Self-pay

## 2018-08-18 ENCOUNTER — Ambulatory Visit (HOSPITAL_BASED_OUTPATIENT_CLINIC_OR_DEPARTMENT_OTHER)
Admission: RE | Admit: 2018-08-18 | Discharge: 2018-08-18 | Disposition: A | Discharge status: Home / Self Care | Payer: 59 | Attending: Otolaryngology | Admitting: Otolaryngology

## 2018-08-18 ENCOUNTER — Ambulatory Visit (HOSPITAL_BASED_OUTPATIENT_CLINIC_OR_DEPARTMENT_OTHER): Payer: 59 | Admitting: Certified Registered Nurse Anesthetist

## 2018-08-18 ENCOUNTER — Encounter (HOSPITAL_BASED_OUTPATIENT_CLINIC_OR_DEPARTMENT_OTHER)
Admission: RE | Disposition: A | Discharge status: Home / Self Care | Payer: Self-pay | Source: Home / Self Care | Attending: Otolaryngology

## 2018-08-18 ENCOUNTER — Other Ambulatory Visit: Payer: Self-pay

## 2018-08-18 DIAGNOSIS — J338 Other polyp of sinus: Secondary | ICD-10-CM

## 2018-08-18 DIAGNOSIS — J321 Chronic frontal sinusitis: Secondary | ICD-10-CM | POA: Diagnosis not present

## 2018-08-18 DIAGNOSIS — J322 Chronic ethmoidal sinusitis: Secondary | ICD-10-CM

## 2018-08-18 DIAGNOSIS — J328 Other chronic sinusitis: Secondary | ICD-10-CM | POA: Insufficient documentation

## 2018-08-18 DIAGNOSIS — J342 Deviated nasal septum: Secondary | ICD-10-CM | POA: Insufficient documentation

## 2018-08-18 DIAGNOSIS — J3489 Other specified disorders of nose and nasal sinuses: Secondary | ICD-10-CM | POA: Insufficient documentation

## 2018-08-18 DIAGNOSIS — J320 Chronic maxillary sinusitis: Secondary | ICD-10-CM

## 2018-08-18 DIAGNOSIS — J343 Hypertrophy of nasal turbinates: Secondary | ICD-10-CM | POA: Diagnosis not present

## 2018-08-18 DIAGNOSIS — J45909 Unspecified asthma, uncomplicated: Secondary | ICD-10-CM | POA: Insufficient documentation

## 2018-08-18 DIAGNOSIS — Z79899 Other long term (current) drug therapy: Secondary | ICD-10-CM | POA: Insufficient documentation

## 2018-08-18 DIAGNOSIS — I10 Essential (primary) hypertension: Secondary | ICD-10-CM | POA: Insufficient documentation

## 2018-08-18 HISTORY — PX: SINUS ENDO WITH FUSION: SHX5329

## 2018-08-18 HISTORY — PX: ENDOSCOPIC CONCHA BULLOSA RESECTION: SHX6395

## 2018-08-18 HISTORY — PX: NASAL SEPTOPLASTY W/ TURBINOPLASTY: SHX2070

## 2018-08-18 HISTORY — DX: Hypertrophy of nasal turbinates: J34.3

## 2018-08-18 HISTORY — DX: Deviated nasal septum: J34.2

## 2018-08-18 HISTORY — DX: Unspecified chronic bronchitis: J42

## 2018-08-18 HISTORY — PX: ETHMOIDECTOMY: SHX5197

## 2018-08-18 HISTORY — PX: MAXILLARY ANTROSTOMY: SHX2003

## 2018-08-18 SURGERY — SEPTOPLASTY, NOSE, WITH NASAL TURBINATE REDUCTION
Anesthesia: General | Site: Nose | Laterality: Right

## 2018-08-18 MED ORDER — AMOXICILLIN 875 MG PO TABS: 875.0000 mg | ORAL_TABLET | Freq: Two times a day (BID) | ORAL | 0 refills | Status: AC

## 2018-08-18 MED ORDER — LABETALOL HCL 5 MG/ML IV SOLN
INTRAVENOUS | Status: DC | PRN
  Administered 2018-08-18 (×2): 5 mg via INTRAVENOUS

## 2018-08-18 MED ORDER — ONDANSETRON HCL 4 MG/2ML IJ SOLN
INTRAMUSCULAR | Status: DC | PRN
  Administered 2018-08-18: 4 mg via INTRAVENOUS

## 2018-08-18 MED ORDER — SCOPOLAMINE 1 MG/3DAYS TD PT72: 1.0000 | MEDICATED_PATCH | Freq: Once | TRANSDERMAL | Status: DC | PRN

## 2018-08-18 MED ORDER — LACTATED RINGERS IV SOLN
INTRAVENOUS | Status: DC
  Administered 2018-08-18 (×2): via INTRAVENOUS

## 2018-08-18 MED ORDER — DEXAMETHASONE SODIUM PHOSPHATE 10 MG/ML IJ SOLN
INTRAMUSCULAR | Status: DC | PRN
  Administered 2018-08-18: 10 mg via INTRAVENOUS

## 2018-08-18 MED ORDER — LIDOCAINE 2% (20 MG/ML) 5 ML SYRINGE
INTRAMUSCULAR | Status: DC | PRN
  Administered 2018-08-18: 100 mg via INTRAVENOUS

## 2018-08-18 MED ORDER — ROCURONIUM BROMIDE 50 MG/5ML IV SOSY
PREFILLED_SYRINGE | INTRAVENOUS | Status: AC
  Filled 2018-08-18: qty 5

## 2018-08-18 MED ORDER — LIDOCAINE 2% (20 MG/ML) 5 ML SYRINGE
INTRAMUSCULAR | Status: AC
  Filled 2018-08-18: qty 5

## 2018-08-18 MED ORDER — FENTANYL CITRATE (PF) 100 MCG/2ML IJ SOLN
INTRAMUSCULAR | Status: AC
  Filled 2018-08-18: qty 2

## 2018-08-18 MED ORDER — PHENYLEPHRINE 40 MCG/ML (10ML) SYRINGE FOR IV PUSH (FOR BLOOD PRESSURE SUPPORT)
PREFILLED_SYRINGE | INTRAVENOUS | Status: AC
  Filled 2018-08-18: qty 10

## 2018-08-18 MED ORDER — LABETALOL HCL 5 MG/ML IV SOLN
INTRAVENOUS | Status: AC
  Filled 2018-08-18: qty 4

## 2018-08-18 MED ORDER — MUPIROCIN 2 % EX OINT
TOPICAL_OINTMENT | CUTANEOUS | Status: DC | PRN
  Administered 2018-08-18: 1 via TOPICAL

## 2018-08-18 MED ORDER — DEXMEDETOMIDINE HCL IN NACL 200 MCG/50ML IV SOLN
INTRAVENOUS | Status: AC
  Filled 2018-08-18: qty 50

## 2018-08-18 MED ORDER — SODIUM CHLORIDE 0.9 % IR SOLN
Status: DC | PRN
  Administered 2018-08-18: 150 mL

## 2018-08-18 MED ORDER — DEXAMETHASONE SODIUM PHOSPHATE 10 MG/ML IJ SOLN
INTRAMUSCULAR | Status: AC
  Filled 2018-08-18: qty 1

## 2018-08-18 MED ORDER — ROCURONIUM BROMIDE 50 MG/5ML IV SOSY
PREFILLED_SYRINGE | INTRAVENOUS | Status: DC | PRN
  Administered 2018-08-18: 50 mg via INTRAVENOUS

## 2018-08-18 MED ORDER — SUGAMMADEX SODIUM 200 MG/2ML IV SOLN
INTRAVENOUS | Status: DC | PRN
  Administered 2018-08-18: 200 mg via INTRAVENOUS

## 2018-08-18 MED ORDER — PROPOFOL 10 MG/ML IV BOLUS
INTRAVENOUS | Status: AC
  Filled 2018-08-18: qty 20

## 2018-08-18 MED ORDER — OXYCODONE-ACETAMINOPHEN 5-325 MG PO TABS: 1.0000 | ORAL_TABLET | ORAL | 0 refills | Status: DC | PRN

## 2018-08-18 MED ORDER — SUGAMMADEX SODIUM 200 MG/2ML IV SOLN
INTRAVENOUS | Status: AC
  Filled 2018-08-18: qty 2

## 2018-08-18 MED ORDER — ONDANSETRON HCL 4 MG/2ML IJ SOLN
INTRAMUSCULAR | Status: AC
  Filled 2018-08-18: qty 2

## 2018-08-18 MED ORDER — OXYMETAZOLINE HCL 0.05 % NA SOLN
NASAL | Status: DC | PRN
  Administered 2018-08-18: 1 via TOPICAL

## 2018-08-18 MED ORDER — DEXMEDETOMIDINE HCL IN NACL 200 MCG/50ML IV SOLN
INTRAVENOUS | Status: DC | PRN
  Administered 2018-08-18: 4 ug via INTRAVENOUS
  Administered 2018-08-18 (×2): 8 ug via INTRAVENOUS

## 2018-08-18 MED ORDER — FENTANYL CITRATE (PF) 100 MCG/2ML IJ SOLN: 50.0000 ug | INTRAMUSCULAR | Status: DC | PRN

## 2018-08-18 MED ORDER — CEFAZOLIN SODIUM-DEXTROSE 2-3 GM-%(50ML) IV SOLR
INTRAVENOUS | Status: DC | PRN
  Administered 2018-08-18: 2 g via INTRAVENOUS

## 2018-08-18 MED ORDER — PHENYLEPHRINE 40 MCG/ML (10ML) SYRINGE FOR IV PUSH (FOR BLOOD PRESSURE SUPPORT)
PREFILLED_SYRINGE | INTRAVENOUS | Status: DC | PRN
  Administered 2018-08-18 (×2): 80 ug via INTRAVENOUS
  Administered 2018-08-18: 120 ug via INTRAVENOUS
  Administered 2018-08-18: 80 ug via INTRAVENOUS
  Administered 2018-08-18: 120 ug via INTRAVENOUS
  Administered 2018-08-18: 80 ug via INTRAVENOUS
  Administered 2018-08-18: 120 ug via INTRAVENOUS

## 2018-08-18 MED ORDER — OXYMETAZOLINE HCL 0.05 % NA SOLN
NASAL | Status: AC
  Filled 2018-08-18: qty 15

## 2018-08-18 MED ORDER — MIDAZOLAM HCL 2 MG/2ML IJ SOLN
INTRAMUSCULAR | Status: AC
  Filled 2018-08-18: qty 2

## 2018-08-18 MED ORDER — MIDAZOLAM HCL 2 MG/2ML IJ SOLN
INTRAMUSCULAR | Status: DC | PRN
  Administered 2018-08-18: 2 mg via INTRAVENOUS

## 2018-08-18 MED ORDER — PROPOFOL 10 MG/ML IV BOLUS
INTRAVENOUS | Status: DC | PRN
  Administered 2018-08-18: 200 mg via INTRAVENOUS

## 2018-08-18 MED ORDER — FENTANYL CITRATE (PF) 100 MCG/2ML IJ SOLN
INTRAMUSCULAR | Status: DC | PRN
  Administered 2018-08-18: 100 ug via INTRAVENOUS
  Administered 2018-08-18 (×4): 50 ug via INTRAVENOUS

## 2018-08-18 MED ORDER — CEFAZOLIN SODIUM 1 G IJ SOLR
INTRAMUSCULAR | Status: AC
  Filled 2018-08-18: qty 20

## 2018-08-18 MED ORDER — LIDOCAINE-EPINEPHRINE 1 %-1:100000 IJ SOLN
INTRAMUSCULAR | Status: DC | PRN
  Administered 2018-08-18: 3 mL

## 2018-08-18 MED ORDER — MIDAZOLAM HCL 2 MG/2ML IJ SOLN: 1.0000 mg | INTRAMUSCULAR | Status: DC | PRN

## 2018-08-18 SURGICAL SUPPLY — 75 items
ATTRACTOMAT 16X20 MAGNETIC DRP (DRAPES) IMPLANT
BLADE RAD40 ROTATE 4M 4 5PK (BLADE) IMPLANT
BLADE RAD40 ROTATE 4M 4MM 5PK (BLADE)
BLADE RAD60 ROTATE M4 4 5PK (BLADE) IMPLANT
BLADE RAD60 ROTATE M4 4MM 5PK (BLADE)
BLADE ROTATE RAD 12 4 M4 (BLADE) IMPLANT
BLADE ROTATE RAD 12 4MM M4 (BLADE)
BLADE ROTATE RAD 40 4 M4 (BLADE) IMPLANT
BLADE ROTATE RAD 40 4MM M4 (BLADE)
BLADE ROTATE TRICUT 4MX13CM M4 (BLADE) ×1
BLADE ROTATE TRICUT 4X13 M4 (BLADE) ×3 IMPLANT
BLADE TRICUT ROTATE M4 4 5PK (BLADE) IMPLANT
BLADE TRICUT ROTATE M4 4MM 5PK (BLADE)
BUR HS RAD FRONTAL 3 (BURR) IMPLANT
BUR HS RAD FRONTAL 3MM (BURR)
CANISTER SUC SOCK COL 7IN (MISCELLANEOUS) ×8 IMPLANT
CANISTER SUCT 1200ML W/VALVE (MISCELLANEOUS) ×4 IMPLANT
COAGULATOR SUCT 6 FR SWTCH (ELECTROSURGICAL)
COAGULATOR SUCT 8FR VV (MISCELLANEOUS) ×4 IMPLANT
COAGULATOR SUCT SWTCH 10FR 6 (ELECTROSURGICAL) IMPLANT
COVER WAND RF STERILE (DRAPES) IMPLANT
DECANTER SPIKE VIAL GLASS SM (MISCELLANEOUS) IMPLANT
DRSG NASAL KENNEDY LMNT 8CM (GAUZE/BANDAGES/DRESSINGS) IMPLANT
DRSG NASOPORE 8CM (GAUZE/BANDAGES/DRESSINGS) ×2 IMPLANT
DRSG TELFA 3X8 NADH (GAUZE/BANDAGES/DRESSINGS) IMPLANT
ELECT REM PT RETURN 9FT ADLT (ELECTROSURGICAL) ×4
ELECTRODE REM PT RTRN 9FT ADLT (ELECTROSURGICAL) ×2 IMPLANT
GLOVE BIO SURGEON STRL SZ7 (GLOVE) ×2 IMPLANT
GLOVE BIO SURGEON STRL SZ7.5 (GLOVE) ×4 IMPLANT
GLOVE BIOGEL PI IND STRL 7.5 (GLOVE) IMPLANT
GLOVE BIOGEL PI INDICATOR 7.5 (GLOVE) ×2
GOWN STRL REUS W/ TWL LRG LVL3 (GOWN DISPOSABLE) ×4 IMPLANT
GOWN STRL REUS W/ TWL XL LVL3 (GOWN DISPOSABLE) IMPLANT
GOWN STRL REUS W/TWL LRG LVL3 (GOWN DISPOSABLE) ×4
GOWN STRL REUS W/TWL XL LVL3 (GOWN DISPOSABLE) ×4
HEMOSTAT SURGICEL 2X14 (HEMOSTASIS) IMPLANT
IV NS 1000ML (IV SOLUTION)
IV NS 1000ML BAXH (IV SOLUTION) IMPLANT
IV NS 500ML (IV SOLUTION) ×4
IV NS 500ML BAXH (IV SOLUTION) ×4 IMPLANT
IV SET EXT 30 76VOL 4 MALE LL (IV SETS) IMPLANT
NDL HYPO 25X1 1.5 SAFETY (NEEDLE) ×2 IMPLANT
NDL PRECISIONGLIDE 27X1.5 (NEEDLE) ×2 IMPLANT
NDL SPNL 25GX3.5 QUINCKE BL (NEEDLE) IMPLANT
NEEDLE HYPO 25X1 1.5 SAFETY (NEEDLE) ×4 IMPLANT
NEEDLE PRECISIONGLIDE 27X1.5 (NEEDLE) IMPLANT
NEEDLE SPNL 25GX3.5 QUINCKE BL (NEEDLE) IMPLANT
NS IRRIG 1000ML POUR BTL (IV SOLUTION) ×4 IMPLANT
PACK BASIN DAY SURGERY FS (CUSTOM PROCEDURE TRAY) ×4 IMPLANT
PACK ENT DAY SURGERY (CUSTOM PROCEDURE TRAY) ×4 IMPLANT
PAD DRESSING TELFA 3X8 NADH (GAUZE/BANDAGES/DRESSINGS) IMPLANT
SLEEVE SCD COMPRESS KNEE MED (MISCELLANEOUS) ×2 IMPLANT
SOLUTION BUTLER CLEAR DIP (MISCELLANEOUS) ×6 IMPLANT
SPLINT NASAL AIRWAY SILICONE (MISCELLANEOUS) ×2 IMPLANT
SPONGE GAUZE 2X2 8PLY STER LF (GAUZE/BANDAGES/DRESSINGS) ×1
SPONGE GAUZE 2X2 8PLY STRL LF (GAUZE/BANDAGES/DRESSINGS) ×3 IMPLANT
SPONGE NEURO XRAY DETECT 1X3 (DISPOSABLE) ×6 IMPLANT
SUCTION FRAZIER HANDLE 10FR (MISCELLANEOUS)
SUCTION TUBE FRAZIER 10FR DISP (MISCELLANEOUS) IMPLANT
SUT CHROMIC 4 0 P 3 18 (SUTURE) ×4 IMPLANT
SUT ETHILON 3 0 PS 1 (SUTURE) ×2 IMPLANT
SUT PLAIN 4 0 ~~LOC~~ 1 (SUTURE) ×4 IMPLANT
SUT PROLENE 3 0 PS 2 (SUTURE) ×4 IMPLANT
SUT VIC AB 4-0 P-3 18XBRD (SUTURE) IMPLANT
SUT VIC AB 4-0 P3 18 (SUTURE)
SYR 50ML LL SCALE MARK (SYRINGE) ×4 IMPLANT
TOWEL GREEN STERILE FF (TOWEL DISPOSABLE) ×4 IMPLANT
TRACKER ENT INSTRUMENT (MISCELLANEOUS) ×4 IMPLANT
TRACKER ENT PATIENT (MISCELLANEOUS) ×4 IMPLANT
TUBE CONNECTING 20'X1/4 (TUBING) ×1
TUBE CONNECTING 20X1/4 (TUBING) ×3 IMPLANT
TUBE SALEM SUMP 12R W/ARV (TUBING) IMPLANT
TUBE SALEM SUMP 16 FR W/ARV (TUBING) ×4 IMPLANT
TUBING STRAIGHTSHOT EPS 5PK (TUBING) ×4 IMPLANT
YANKAUER SUCT BULB TIP NO VENT (SUCTIONS) ×4 IMPLANT

## 2018-08-18 NOTE — Transfer of Care (Signed)
Immediate Anesthesia Transfer of Care Note  Patient: Isaiah Sellers  Procedure(s) Performed: NASAL SEPTOPLASTY WITH BILATERAL TURBINATE REDUCTION (Bilateral Nose) ENDOSCOPIC RIGHT CONCHA BULLOSA RESECTION (Right Nose) ENDOSCOPIC MAXILLARY ANTROSTOMY WITH TISSUE REMOVAL (Bilateral Nose) TOTAL ETHMOIDECTOMY AND FRONTAL RECESS EXPLORATION (Bilateral Nose) SINUS ENDOSCOPY WITH FUSION NAVIGATION (Bilateral Nose)  Patient Location: PACU  Anesthesia Type:General  Level of Consciousness: awake, alert  and oriented  Airway & Oxygen Therapy: Patient Spontanous Breathing and Patient connected to face mask oxygen  Post-op Assessment: Report given to RN and Post -op Vital signs reviewed and stable  Post vital signs: Reviewed and stable  Last Vitals:  Vitals Value Taken Time  BP 106/80 08/18/2018 11:34 AM  Temp    Pulse 88 08/18/2018 11:36 AM  Resp 11 08/18/2018 11:36 AM  SpO2 100 % 08/18/2018 11:36 AM  Vitals shown include unvalidated device data.  Last Pain:  Vitals:   08/18/18 0807  TempSrc: Oral  PainSc: 0-No pain         Complications: No apparent anesthesia complications

## 2018-08-18 NOTE — Anesthesia Preprocedure Evaluation (Signed)
Anesthesia Evaluation  Patient identified by MRN, date of birth, ID band Patient awake    Reviewed: Allergy & Precautions, NPO status , Patient's Chart, lab work & pertinent test results  Airway Mallampati: II  TM Distance: >3 FB Neck ROM: Full    Dental no notable dental hx. (+) Teeth Intact   Pulmonary asthma ,  Chronic Maxillary, Frontal and Ethmoid sinusitis Bilateral turbinate hypertrophy Deviated nasal septum Concha bullosa right   Pulmonary exam normal breath sounds clear to auscultation       Cardiovascular hypertension, Pt. on medications Normal cardiovascular exam Rhythm:Regular Rate:Normal     Neuro/Psych negative neurological ROS  negative psych ROS   GI/Hepatic negative GI ROS, Neg liver ROS,   Endo/Other  negative endocrine ROS  Renal/GU negative Renal ROS  negative genitourinary   Musculoskeletal negative musculoskeletal ROS (+)   Abdominal   Peds  Hematology negative hematology ROS (+)   Anesthesia Other Findings   Reproductive/Obstetrics                             Anesthesia Physical Anesthesia Plan  ASA: II  Anesthesia Plan: General   Post-op Pain Management:    Induction: Intravenous  PONV Risk Score and Plan: 3 and Ondansetron, Dexamethasone and Treatment may vary due to age or medical condition  Airway Management Planned: Oral ETT  Additional Equipment:   Intra-op Plan:   Post-operative Plan: Extubation in OR  Informed Consent: I have reviewed the patients History and Physical, chart, labs and discussed the procedure including the risks, benefits and alternatives for the proposed anesthesia with the patient or authorized representative who has indicated his/her understanding and acceptance.     Dental advisory given  Plan Discussed with: CRNA and Surgeon  Anesthesia Plan Comments:         Anesthesia Quick Evaluation

## 2018-08-18 NOTE — Anesthesia Postprocedure Evaluation (Signed)
Anesthesia Post Note  Patient: Isaiah Sellers  Procedure(s) Performed: NASAL SEPTOPLASTY WITH BILATERAL TURBINATE REDUCTION (Bilateral Nose) ENDOSCOPIC RIGHT CONCHA BULLOSA RESECTION (Right Nose) ENDOSCOPIC MAXILLARY ANTROSTOMY WITH TISSUE REMOVAL (Bilateral Nose) TOTAL ETHMOIDECTOMY AND FRONTAL RECESS EXPLORATION (Bilateral Nose) SINUS ENDOSCOPY WITH FUSION NAVIGATION (Bilateral Nose)     Patient location during evaluation: PACU Anesthesia Type: General Level of consciousness: awake and alert and oriented Pain management: pain level controlled Vital Signs Assessment: post-procedure vital signs reviewed and stable Respiratory status: spontaneous breathing, nonlabored ventilation and respiratory function stable Cardiovascular status: blood pressure returned to baseline and stable Postop Assessment: no apparent nausea or vomiting Anesthetic complications: no    Last Vitals:  Vitals:   08/18/18 1133 08/18/18 1145  BP: 106/80 107/69  Pulse: 87 88  Resp: 20 18  Temp: 36.7 C   SpO2: 100% 100%    Last Pain:  Vitals:   08/18/18 1133  TempSrc:   PainSc: Asleep                 Anushri Casalino A.

## 2018-08-18 NOTE — Op Note (Signed)
DATE OF PROCEDURE: 08/18/2018  OPERATIVE REPORT   SURGEON: Newman Pies, MD   PREOPERATIVE DIAGNOSES:  1. Bilateral chronic rhinosinusitis and polyposis, involving the maxillary, ethmoid, and frontal sinuses. 2. Nasal septal deviation.  3. Bilateral inferior turbinate hypertrophy.  4. Right concha bullosa. 5. Chronic nasal obstruction.  POSTOPERATIVE DIAGNOSES:  1. Bilateral chronic rhinosinusitis and polyposis, involving the maxillary, ethmoid, and frontal sinuses. 2. Nasal septal deviation.  3. Bilateral inferior turbinate hypertrophy.  4. Right concha bullosa. 5. Chronic nasal obstruction.  PROCEDURE PERFORMED:  1. Bilateral endoscopic frontal exploration and total ethmoidectomy with polyp removal. 2. Bilateral endoscopic maxillary antrostomy with polyp removal 3. Septoplasty.  4. Bilateral partial inferior turbinate resection.  5. Right endoscopic concha bullosa resection. 6. FUSION stereotactic image guidance.  ANESTHESIA: General endotracheal tube anesthesia.   COMPLICATIONS: None.   ESTIMATED BLOOD LOSS: 600 mL.   INDICATION FOR PROCEDURE: EDIN COMAR is a 58 y.o. male with a history of chronic nasal obstruction chronic rhinosinusitis, and sinonasal polyposis. The patient was previously treated with multiple courses of antibiotics, allergy medications, steroid nasal spray, and systemic steroids. However, the patient continued to be symptomatic. On examination, the patient was noted to have bilateral severe inferior turbinate hypertrophy and significant nasal septal deviation, causing significant nasal obstruction. His CT scan also showed a large right concha bullosa, partial opacification of his frontal recesses, maxillary, and ethmoid sinuses.Based on the above findings, the decision was made for the patient to undergo the above-stated procedures. The risks, benefits, alternatives, and details of the procedures were discussed with the patient. Questions were invited and  answered. Informed consent was obtained.   DESCRIPTION OF PROCEDURE: The patient was taken to the operating room and placed supine on the operating table. General endotracheal tube anesthesia was administered by the anesthesiologist. The patient was positioned, and prepped and draped in the standard fashion for nasal surgery. Pledgets soaked with Afrin were placed in both nasal cavities for decongestion. The pledgets were subsequently removed. The FUSION stereotactic image guidance marker was placed. The image guidance system was functional throughout the case.  Examination of the nasal cavity revealed a severe nasal septal deviationd. 1% lidocaine with 1:100,000 epinephrine was injected onto the nasal septum bilaterally. A hemitransfixion incision was made on the left side. The mucosal flap was carefully elevated on the left side. A cartilaginous incision was made 1 cm superior to the caudal margin of the nasal septum. Mucosal flap was also elevated on the right side in the similar fashion. It should be noted that due to the severe septal deviation, the deviated portion of the cartilaginous and bony septum had to be removed in piecemeal fashion. Once the deviated portions were removed, a straight midline septum was achieved. The septum was then quilted with 4-0 plain gut sutures. The hemitransfixion incision was closed with interrupted 4-0 chromic sutures.   The inferior one half of both hypertrophied inferior turbinate was crossclamped with a Kelly clamp. The inferior one half of each inferior turbinate was then resected with a pair of cross cutting scissors. Hemostasis was achieved with a suction cautery device.   Using a 0 endoscope, the right nasal cavity was examined. A large concha bullosa was noted. Using Tru-Cut forceps, the inferior one third and medial one half of the concha bullosa was resected. Polypoid tissue was noted within the middle meatus. The polypoid tissue was removed using a  combination of microdebrider and Blakesley forceps. The uncinate process was resected with a freer elevator. The maxillary antrum  was entered and enlarged using a combination of backbiter and microdebrider. Polypoid tissue was removed from the right maxillary sinus.  Attention was then focused on the ethmoid sinuses. The bony partitions of the anterior and posterior ethmoid cavities were taken down. Polypoid tissue was noted and removed.   Attention was then focused on the frontal sinus. The frontal recess was identified and enlarged by removing the surrounding bony partitions. Polypoid tissue was removed from the frontal recess. All 3 paranasal sinuses were copiously irrigated with saline solution.  The same procedure was repeated on the left side except no concha bullosa was noted on the left side. More polyps were noted on the left side. All polyps were removed. Doyle splints were applied to the nasal septum.  The care of the patient was turned over to the anesthesiologist. The patient was awakened from anesthesia without difficulty. The patient was extubated and transferred to the recovery room in good condition.   OPERATIVE FINDINGS: Nasal septal deviation and bilateral inferior turbinate hypertrophy. 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  SPECIMEN: Bilateral sinus contents.  FOLLOWUP CARE: The patient be discharged home once he is awake and alert. The patient will be placed on Percocet 1 tablets p.o. q.4 hours p.r.n. pain, and amoxicillin 875 mg p.o. b.i.d. for 5 days. The patient will follow up in my office in approximately 3 days for splint removal.   Keyonna Comunale Philomena Doheny, MD

## 2018-08-18 NOTE — Anesthesia Procedure Notes (Signed)
Procedure Name: Intubation Date/Time: 08/18/2018 9:31 AM Performed by: Pearson Grippe, CRNA Pre-anesthesia Checklist: Patient identified, Emergency Drugs available, Suction available and Patient being monitored Patient Re-evaluated:Patient Re-evaluated prior to induction Oxygen Delivery Method: Circle system utilized Preoxygenation: Pre-oxygenation with 100% oxygen Induction Type: IV induction Ventilation: Mask ventilation without difficulty and Oral airway inserted - appropriate to patient size Laryngoscope Size: Glidescope and 3 Grade View: Grade I Tube type: Oral Tube size: 8.0 mm Number of attempts: 1 Airway Equipment and Method: Stylet and Oral airway Placement Confirmation: ETT inserted through vocal cords under direct vision,  positive ETCO2 and breath sounds checked- equal and bilateral Secured at: 24 cm Tube secured with: Tape Dental Injury: Teeth and Oropharynx as per pre-operative assessment  Difficulty Due To: Difficulty was anticipated, Difficult Airway- due to large tongue and Difficult Airway- due to reduced neck mobility Future Recommendations: Recommend- induction with short-acting agent, and alternative techniques readily available Comments: DLx1 Mil 2, Grade IV view, Glidescope #3 x1, Grade I view, ATOI, +BBS/etCO2, secured with tape @ 24cm @ lip.

## 2018-08-18 NOTE — Discharge Instructions (Signed)

## 2018-08-18 NOTE — H&P (Signed)
Cc: Recurrent sinusitis, polyposis, nasal obstruction  HPI: The patient is a 57 year old male who returns today for his follow-up evaluation. The patient has a history of chronic rhinosinusitis with nasal septal deviation and bilateral inferior turbinate hypertrophy.  The patient was also noted to have bilateral sinonasal polyps.  His CT scan showed mucosal edema of the maxillary, ethmoid and frontal recess.  He also has a large right concha bullosa.  The patient was treated with multiple courses of Prednisone dosepak, nasacort, Zyrtec and Mucinex.  He was also treated with multiple courses of antibiotics.  The patient returns today complaining of persistent symptoms.  He has significant difficulty breathing through his nose. He also complains of persistent pressure and pain.   Exam: The nasal cavities were decongested and anesthetised with a combination of oxymetazoline and 4% lidocaine solution.  The flexible scope was inserted into the right nasal cavity.  Endoscopy of the inferior and middle meatus was performed.  Edematous and polypoid mucosa was noted.  Severe NSD. Nasopharynx was clear.  Turbinates were hypertrophied but without mass.  Incomplete response to decongestion.  The procedure was repeated on the contralateral side with similar findings.  The patient tolerated the procedure well.  Instructions were given to avoid eating or drinking for 2 hours.    Assessment: 1.  Chronic rhinosinusitis and polyposis, involving the maxillary, ethmoid and frontal recesses bilaterally.  2.  Severe nasal septal deviation, bilateral inferior turbinate hypertrophy, and right concha bullosa.  Plan: 1.  The nasal endoscopy findings are reviewed with the patient.  2.  The patient should continue with his allergy treatment regimen.  3.  Prednisone dosepak for 12 additional days.  4.  In light of his persistent symptoms, he will benefit from undergoing surgical intervention with septoplasty, turbinate  reduction, right concha bullosa resection, and bilateral endoscopic sinus surgery.  The risks, benefits and details of the procedure are again reviewed with the patient.  5.  The patient would like to proceed with the procedures.

## 2018-08-19 ENCOUNTER — Encounter (HOSPITAL_BASED_OUTPATIENT_CLINIC_OR_DEPARTMENT_OTHER): Payer: Self-pay | Admitting: Otolaryngology

## 2018-08-26 ENCOUNTER — Encounter: Payer: Self-pay | Admitting: Allergy and Immunology

## 2018-08-26 ENCOUNTER — Ambulatory Visit: Payer: 59 | Admitting: Allergy and Immunology

## 2018-08-26 VITALS — BP 116/80 | HR 100 | Temp 98.4°F | Resp 20 | Ht 73.0 in | Wt 228.2 lb

## 2018-08-26 DIAGNOSIS — J3089 Other allergic rhinitis: Secondary | ICD-10-CM

## 2018-08-26 DIAGNOSIS — J4530 Mild persistent asthma, uncomplicated: Secondary | ICD-10-CM | POA: Diagnosis not present

## 2018-08-26 MED ORDER — MONTELUKAST SODIUM 10 MG PO TABS: ORAL_TABLET | ORAL | 5 refills | Status: DC

## 2018-08-26 MED ORDER — ALBUTEROL SULFATE HFA 108 (90 BASE) MCG/ACT IN AERS: 2.0000 | INHALATION_SPRAY | RESPIRATORY_TRACT | 1 refills | Status: DC | PRN

## 2018-08-26 NOTE — Assessment & Plan Note (Signed)
Currently with suboptimal control.  We will step up therapy at this time.  A prescription has been provided for montelukast 10 mg daily at bedtime.  Continue albuterol HFA, 1 to 2 inhalations every 4-6 hours as needed.  Subjective and objective measures of pulmonary function will be followed and the treatment plan will be adjusted accordingly.

## 2018-08-26 NOTE — Patient Instructions (Addendum)
Mild persistent asthma Currently with suboptimal control.  We will step up therapy at this time.  A prescription has been provided for montelukast 10 mg daily at bedtime.  Continue albuterol HFA, 1 to 2 inhalations every 4-6 hours as needed.  Subjective and objective measures of pulmonary function will be followed and the treatment plan will be adjusted accordingly.  Other allergic rhinitis  Continue appropriate allergen avoidance measures and nasal saline irrigation as needed.  Follow-up with Dr. Suszanne Connerseoh on February 11th for recommendations.   Return in about 6 months (around 02/24/2019), or if symptoms worsen or fail to improve.

## 2018-08-26 NOTE — Assessment & Plan Note (Signed)
   Continue appropriate allergen avoidance measures and nasal saline irrigation as needed.  Follow-up with Dr. Suszanne Conners on February 11th for recommendations.

## 2018-08-26 NOTE — Progress Notes (Signed)
Follow-up Note  RE: Isaiah Sellers MRN: [id number] DOB: 31-Oct-1960 Date of Office Visit: 08/26/2018  Primary care provider: Andreas Blower., MD Referring provider: Andreas Blower., MD  History of present illness: Isaiah Sellers is a 58 y.o. male with allergic rhinitis and asthma presenting today for follow-up.  He was last seen in this clinic in July 2019.  He reports that in the interval since his previous visit he typically requires albuterol rescue 2-3 times per week on average, typically with dust exposure and rapid weather changes.  He underwent septoplasty on August 18, 2018 under the care of Dr. Suszanne Conners.  He reports that since the stents have been removed that he has experienced significantly increased airflow through his nose.  He is scheduled to follow-up with Dr. Jodean Lima on February 11th.  Assessment and plan: Mild persistent asthma Currently with suboptimal control.  We will step up therapy at this time.  A prescription has been provided for montelukast 10 mg daily at bedtime.  Continue albuterol HFA, 1 to 2 inhalations every 4-6 hours as needed.  Subjective and objective measures of pulmonary function will be followed and the treatment plan will be adjusted accordingly.  Other allergic rhinitis  Continue appropriate allergen avoidance measures and nasal saline irrigation as needed.  Follow-up with Dr. Suszanne Conners on February 11th for recommendations.   Meds ordered this encounter  Medications  . montelukast (SINGULAIR) 10 MG tablet    Sig: 1 tablet once at night for coughing or wheezing.    Dispense:  34 tablet    Refill:  5  . albuterol (VENTOLIN HFA) 108 (90 Base) MCG/ACT inhaler    Sig: Inhale 2 puffs into the lungs every 4 (four) hours as needed for wheezing or shortness of breath.    Dispense:  1 Inhaler    Refill:  1    Diagnostics: Spirometry:  Normal with an FEV1 of 104% predicted.  Please see scanned spirometry results for details.    Physical  examination: Blood pressure 116/80, pulse 100, temperature 98.4 F (36.9 C), temperature source Oral, resp. rate 20, height 6\' 1"  (1.854 m), weight 228 lb 2.8 oz (103.5 kg), SpO2 96 %.  General: Alert, interactive, in no acute distress. HEENT: TMs pearly gray, turbinates minimally edematous without discharge, post-pharynx unremarkable. Neck: Supple without lymphadenopathy. Lungs: Clear to auscultation without wheezing, rhonchi or rales. CV: Normal S1, S2 without murmurs. Skin: Warm and dry, without lesions or rashes.  The following portions of the patient's history were reviewed and updated as appropriate: allergies, current medications, past family history, past medical history, past social history, past surgical history and problem list.  Allergies as of 08/26/2018      Reactions   Chocolate Hives      Medication List       Accurate as of August 26, 2018  9:19 PM. Always use your most recent med list.        albuterol 108 (90 Base) MCG/ACT inhaler Commonly known as:  VENTOLIN HFA Inhale 2 puffs into the lungs every 4 (four) hours as needed for wheezing or shortness of breath.   amLODipine 10 MG tablet Commonly known as:  NORVASC TK 1 T PO QD   azelastine 0.1 % nasal spray Commonly known as:  ASTELIN 1-2 sprays per nostril 2 times daily as needed   cetirizine 10 MG tablet Commonly known as:  ZYRTEC Take 10 mg by mouth as needed.   clindamycin 1 % lotion Commonly known as:  CLEOCIN  T APP TO ACNE PRONE AREAS ON FACE BID FOR FLARES   DAILY MULTIVITAMIN PO Take by mouth.   doxycycline 100 MG capsule Commonly known as:  VIBRAMYCIN TK 1 C PO BID   fluocinonide 0.05 % external solution Commonly known as:  LIDEX APPLY EXTERNALLY TO THE AFFECTED AREA ON SCALP EVERY DAY AS NEEDED   fluticasone 50 MCG/ACT nasal spray Commonly known as:  FLONASE Place into both nostrils daily.   losartan 50 MG tablet Commonly known as:  COZAAR Take by mouth.   montelukast 10 MG  tablet Commonly known as:  SINGULAIR 1 tablet once at night for coughing or wheezing.   VITAMIN D PO Take 1 tablet by mouth daily.       Allergies  Allergen Reactions  . Chocolate Hives    I appreciate the opportunity to take part in Isaiah Sellers's care. Please do not hesitate to contact me with questions.  Sincerely,   R. Jorene Guest, MD

## 2018-10-02 ENCOUNTER — Other Ambulatory Visit: Payer: Self-pay | Admitting: Family Medicine

## 2018-10-05 ENCOUNTER — Other Ambulatory Visit: Payer: Self-pay | Admitting: Allergy

## 2018-10-05 MED ORDER — FLUTICASONE PROPIONATE 50 MCG/ACT NA SUSP: 2.0000 | Freq: Every day | NASAL | 5 refills | Status: DC

## 2018-11-25 ENCOUNTER — Other Ambulatory Visit: Payer: Self-pay

## 2018-11-25 MED ORDER — ALBUTEROL SULFATE HFA 108 (90 BASE) MCG/ACT IN AERS: 2.0000 | INHALATION_SPRAY | RESPIRATORY_TRACT | 1 refills | Status: DC | PRN

## 2018-11-25 NOTE — Telephone Encounter (Signed)
RF for Ventolin HFA x 1 with 1 refill at [facility]

## 2018-11-26 ENCOUNTER — Ambulatory Visit (INDEPENDENT_AMBULATORY_CARE_PROVIDER_SITE_OTHER): Payer: 59 | Admitting: Allergy and Immunology

## 2018-11-26 ENCOUNTER — Other Ambulatory Visit: Payer: Self-pay

## 2018-11-26 ENCOUNTER — Encounter: Payer: Self-pay | Admitting: Allergy and Immunology

## 2018-11-26 DIAGNOSIS — R05 Cough: Secondary | ICD-10-CM | POA: Diagnosis not present

## 2018-11-26 DIAGNOSIS — K219 Gastro-esophageal reflux disease without esophagitis: Secondary | ICD-10-CM | POA: Diagnosis not present

## 2018-11-26 DIAGNOSIS — R053 Chronic cough: Secondary | ICD-10-CM

## 2018-11-26 DIAGNOSIS — J3089 Other allergic rhinitis: Secondary | ICD-10-CM

## 2018-11-26 DIAGNOSIS — J4530 Mild persistent asthma, uncomplicated: Secondary | ICD-10-CM

## 2018-11-26 MED ORDER — FLUTICASONE PROPIONATE HFA 110 MCG/ACT IN AERO: 2.0000 | INHALATION_SPRAY | Freq: Two times a day (BID) | RESPIRATORY_TRACT | 5 refills | Status: DC

## 2018-11-26 MED ORDER — SPACER/AERO-HOLDING CHAMBERS DEVI: 0 refills | Status: AC

## 2018-11-26 MED ORDER — AZELASTINE HCL 0.1 % NA SOLN: NASAL | 5 refills | Status: DC

## 2018-11-26 MED ORDER — MONTELUKAST SODIUM 10 MG PO TABS: ORAL_TABLET | ORAL | 5 refills | Status: DC

## 2018-11-26 NOTE — Assessment & Plan Note (Signed)
   Continue appropriate allergen avoidance measures.  For now, I have recommended using azelastine nasal spray twice daily.  Nasal saline lavage (NeilMed) has been recommended as needed and prior to medicated nasal sprays along with instructions for proper administration.  For thick post nasal drainage, add guaifenesin 1200 mg (Mucinex Maximum Strength)  twice daily as needed with adequate hydration as discussed.

## 2018-11-26 NOTE — Assessment & Plan Note (Signed)
The most common causes of chronic cough include the following: upper airway cough syndrome (UACS) which is caused by variety of rhinosinus conditions; asthma; gastroesophageal reflux disease (GERD); chronic bronchitis from cigarette smoking or other inhaled environmental irritants; non-asthmatic eosinophilic bronchitis; and bronchiectasis. In prospective studies, these conditions have accounted for up to 94% of the causes of chronic cough in immunocompetent adults. The history and physical examination suggest that his cough is multifactorial with contribution from acid reflux, bronchial hyperresponsiveness, and postnasal drainage. We will address these issues at this time.   Treatment plan as outlined below.    We will regroup in 8 weeks to assess treatment response and adjust therapy accordingly.

## 2018-11-26 NOTE — Assessment & Plan Note (Addendum)
   Continue montelukast 10 mg daily at bedtime and albuterol HFA, 1 to 2 inhalations every 4-6 hours if needed.  The montelukast boxed warning has been discussed and Raiford Noble has verbalized understanding.  For now, and during respiratory tract infections or asthma flares, add Flovent 110g 2 inhalations 2 times per day until symptoms have returned to baseline.  To maximize pulmonary deposition, a spacer has been provided along with instructions for its proper administration with an HFA inhaler.  Subjective and objective measures of pulmonary function will be followed and the treatment plan will be adjusted accordingly.

## 2018-11-26 NOTE — Patient Instructions (Addendum)
Cough, persistent The most common causes of chronic cough include the following: upper airway cough syndrome (UACS) which is caused by variety of rhinosinus conditions; asthma; gastroesophageal reflux disease (GERD); chronic bronchitis from cigarette smoking or other inhaled environmental irritants; non-asthmatic eosinophilic bronchitis; and bronchiectasis. In prospective studies, these conditions have accounted for up to 94% of the causes of chronic cough in immunocompetent adults. The history and physical examination suggest that his cough is multifactorial with contribution from acid reflux, bronchial hyperresponsiveness, and postnasal drainage. We will address these issues at this time.   Treatment plan as outlined below.    We will regroup in 8 weeks to assess treatment response and adjust therapy accordingly.  Mild persistent asthma  Continue montelukast 10 mg daily at bedtime and albuterol HFA, 1 to 2 inhalations every 4-6 hours if needed.  The montelukast boxed warning has been discussed and Raiford Noble has verbalized understanding.  For now, and during respiratory tract infections or asthma flares, add Flovent 110g 2 inhalations 2 times per day until symptoms have returned to baseline.  To maximize pulmonary deposition, a spacer has been provided along with instructions for its proper administration with an HFA inhaler.  Subjective and objective measures of pulmonary function will be followed and the treatment plan will be adjusted accordingly.  Other allergic rhinitis  Continue appropriate allergen avoidance measures.  For now, I have recommended using azelastine nasal spray twice daily.  Nasal saline lavage (NeilMed) has been recommended as needed and prior to medicated nasal sprays along with instructions for proper administration.  For thick post nasal drainage, add guaifenesin 1200 mg (Mucinex Maximum Strength)  twice daily as needed with adequate hydration as discussed.  Acid  reflux  Appropriate reflux lifestyle modifications have been provided.  For now, I have recommended taking over-the-counter Nexium daily rather than as needed.   Return in about 2 months (around 01/26/2019), or if symptoms worsen or fail to improve.

## 2018-11-26 NOTE — Assessment & Plan Note (Signed)
   Appropriate reflux lifestyle modifications have been provided.  For now, I have recommended taking over-the-counter Nexium daily rather than as needed.

## 2018-11-26 NOTE — Progress Notes (Signed)
Follow-up Telemedicine Note  RE: Isaiah Sellers MRN: [id number] DOB: 1961/02/13 Date of Telemedicine Visit: 11/26/2018  Primary care provider: Andreas Blower., MD Referring provider: Andreas Blower., MD  Telemedicine Follow Up Visit via Telephone: I connected with Isaiah Sellers for a follow up on 11/26/18 by telephone and verified that I am speaking with the correct person using two identifiers.   The limitations, risks, security and privacy concerns of performing an evaluation and management service by telemedicine, the availability of in person appointments, and that there may be a patient responsible charge related to this service were discussed. The patient expressed understanding and agreed to proceed.  Patient is at home.  Provider is at the office.  Visit start time: 11:02 AM Visit end time: 11:27 AM Insurance consent/check in by: [facility] consent and medical assistant/nurse: Logan  History of present illness: Isaiah Sellers is a 58 y.o. male with persistent asthma and allergic rhinitis presenting today via telemedicine for sick visit.  He was last seen in this clinic on August 26, 2018.  Isaiah Sellers reports that over the past 2 weeks he has experienced a rather persistent dry cough as well as occasional chest tightness.  The coughing is worse during the day and does not seem to bother him at nighttime or wake him from his sleep.  The cough and chest tightness have been worse when doing yard work out doors and with dust exposure at work.  He and his wife have noticed that the coughing, globus sensation, and throat clearing seem to be worse after meals.  He experiences occasional heartburn and will take Nexium sporadically when he deems it necessary.  He denies fevers, chills, chest pain, and known COVID-19 exposure. He reports that he has been experiencing rhinorrhea, sneezing, ocular pruritus, and ear canal pruritus.  These symptoms tend to be more frequent in the morning  hours.  He has been using [employer] eyedrops and azelastine nasal spray as needed.  Refill Monte  For now and during flares add flovent 110 2/2 with spacer.   muc  azelastine  Take nexium  Fu in 2 mo  Assessment and plan: Cough, persistent The most common causes of chronic cough include the following: upper airway cough syndrome (UACS) which is caused by variety of rhinosinus conditions; asthma; gastroesophageal reflux disease (GERD); chronic bronchitis from cigarette smoking or other inhaled environmental irritants; non-asthmatic eosinophilic bronchitis; and bronchiectasis. In prospective studies, these conditions have accounted for up to 94% of the causes of chronic cough in immunocompetent adults. The history and physical examination suggest that his cough is multifactorial with contribution from acid reflux, bronchial hyperresponsiveness, and postnasal drainage. We will address these issues at this time.   Treatment plan as outlined below.    We will regroup in 8 weeks to assess treatment response and adjust therapy accordingly.  Mild persistent asthma  Continue montelukast 10 mg daily at bedtime and albuterol HFA, 1 to 2 inhalations every 4-6 hours if needed.  The montelukast boxed warning has been discussed and Isaiah Sellers has verbalized understanding.  For now, and during respiratory tract infections or asthma flares, add Flovent 110g 2 inhalations 2 times per day until symptoms have returned to baseline.  To maximize pulmonary deposition, a spacer has been provided along with instructions for its proper administration with an HFA inhaler.  Subjective and objective measures of pulmonary function will be followed and the treatment plan will be adjusted accordingly.  Other allergic rhinitis  Continue appropriate allergen avoidance  measures.  For now, I have recommended using azelastine nasal spray twice daily.  Nasal saline lavage (NeilMed) has been recommended as needed and prior  to medicated nasal sprays along with instructions for proper administration.  For thick post nasal drainage, add guaifenesin 1200 mg (Mucinex Maximum Strength)  twice daily as needed with adequate hydration as discussed.  Acid reflux  Appropriate reflux lifestyle modifications have been provided.  For now, I have recommended taking over-the-counter Nexium daily rather than as needed.   Meds ordered this encounter  Medications   azelastine (ASTELIN) 0.1 % nasal spray    Sig: 1-2 sprays per nostril 2 times daily as needed    Dispense:  30 mL    Refill:  5   montelukast (SINGULAIR) 10 MG tablet    Sig: 1 tablet once at night for coughing or wheezing.    Dispense:  34 tablet    Refill:  5   fluticasone (FLOVENT HFA) 110 MCG/ACT inhaler    Sig: Inhale 2 puffs into the lungs 2 (two) times daily.    Dispense:  1 Inhaler    Refill:  5   Spacer/Aero-Holding Chambers DEVI    Sig: Use as directed with inhaler    Dispense:  1 each    Refill:  0    Diagnostics: None.   Physical examination: Physical Exam Not obtained as encounter was done via telephone.   The following portions of the patient's history were reviewed and updated as appropriate: allergies, current medications, past family history, past medical history, past social history, past surgical history and problem list.  Allergies as of 11/26/2018      Reactions   Chocolate Hives      Medication List       Accurate as of November 26, 2018 12:20 PM. Always use your most recent med list.        albuterol 108 (90 Base) MCG/ACT inhaler Commonly known as:  Ventolin HFA Inhale 2 puffs into the lungs every 4 (four) hours as needed for wheezing or shortness of breath.   amLODipine 10 MG tablet Commonly known as:  NORVASC TK 1 T PO QD   azelastine 0.1 % nasal spray Commonly known as:  ASTELIN 1-2 sprays per nostril 2 times daily as needed   cetirizine 10 MG tablet Commonly known as:  ZYRTEC Take 10 mg by mouth as  needed.   clindamycin 1 % lotion Commonly known as:  CLEOCIN T APP TO ACNE PRONE AREAS ON FACE BID FOR FLARES   DAILY MULTIVITAMIN PO Take by mouth.   doxycycline 100 MG capsule Commonly known as:  VIBRAMYCIN TK 1 C PO BID   fluocinonide 0.05 % external solution Commonly known as:  LIDEX APPLY EXTERNALLY TO THE AFFECTED AREA ON SCALP EVERY DAY AS NEEDED   fluticasone 110 MCG/ACT inhaler Commonly known as:  Flovent HFA Inhale 2 puffs into the lungs 2 (two) times daily.   fluticasone 50 MCG/ACT nasal spray Commonly known as:  FLONASE Place 2 sprays into both nostrils daily.   losartan 50 MG tablet Commonly known as:  COZAAR Take by mouth.   montelukast 10 MG tablet Commonly known as:  Singulair 1 tablet once at night for coughing or wheezing.   Spacer/Aero-Holding [employer] Use as directed with inhaler   VITAMIN D PO Take 1 tablet by mouth daily.       Allergies  Allergen Reactions   Chocolate Hives   Review of systems: Review of systems negative except as  noted in HPI / PMHx or noted below: Constitutional: Negative.  HENT: Negative.   Eyes: Negative.  Respiratory: Negative.   Cardiovascular: Negative.  Gastrointestinal: Negative.  Genitourinary: Negative.  Musculoskeletal: Negative.  Neurological: Negative.  Endo/Heme/Allergies: Negative.  Cutaneous: Negative.  Past Medical History:  Diagnosis Date   Asthma    Chronic bronchitis (HCC)    Deviated septum    Hypertension    Nasal turbinate hypertrophy     Family History  Problem Relation Age of Onset   Angioedema Neg Hx    Allergic rhinitis Neg Hx    Asthma Neg Hx    Immunodeficiency Neg Hx    Eczema Neg Hx    Urticaria Neg Hx     Social History   Socioeconomic History   Marital status: Married    Spouse name: Not on file   Number of children: Not on file   Years of education: Not on file   Highest education level: Not on file  Occupational History   Not on file   Social Needs   Financial resource strain: Not on file   Food insecurity:    Worry: Not on file    Inability: Not on file   Transportation needs:    Medical: Not on file    Non-medical: Not on file  Tobacco Use   Smoking status: Never Smoker   Smokeless tobacco: Never Used  Substance and Sexual Activity   Alcohol use: Yes    Alcohol/week: 2.0 standard drinks    Types: 2 Cans of beer per week    Comment: socially   Drug use: No   Sexual activity: Yes    Birth control/protection: Condom  Lifestyle   Physical activity:    Days per week: Not on file    Minutes per session: Not on file   Stress: Not on file  Relationships   Social connections:    Talks on phone: Not on file    Gets together: Not on file    Attends religious service: Not on file    Active member of club or organization: Not on file    Attends meetings of clubs or organizations: Not on file    Relationship status: Not on file   Intimate partner violence:    Fear of current or ex partner: Not on file    Emotionally abused: Not on file    Physically abused: Not on file    Forced sexual activity: Not on file  Other Topics Concern   Not on file  Social History Narrative   Not on file    Previous notes and tests were reviewed.  I discussed the assessment and treatment plan with the patient. The patient was provided an opportunity to ask questions and all were answered. The patient agreed with the plan and demonstrated an understanding of the instructions.   The patient was advised to call back or seek an in-person evaluation if the symptoms worsen or if the condition fails to improve as anticipated.  I provided 25 minutes of non-face-to-face time during this encounter.  I appreciate the opportunity to take part in Riyad's care. Please do not hesitate to contact me with questions.  Sincerely,   R. Jorene Guestarter Emillee Talsma, MD

## 2019-01-14 ENCOUNTER — Other Ambulatory Visit: Payer: Self-pay | Admitting: Allergy and Immunology

## 2019-02-16 ENCOUNTER — Other Ambulatory Visit: Payer: Self-pay | Admitting: Allergy and Immunology

## 2019-02-24 ENCOUNTER — Ambulatory Visit: Payer: 59 | Admitting: Allergy and Immunology

## 2019-04-29 ENCOUNTER — Ambulatory Visit: Payer: 59 | Admitting: Allergy and Immunology

## 2019-04-29 ENCOUNTER — Ambulatory Visit: Payer: 59 | Admitting: Family Medicine

## 2019-04-29 ENCOUNTER — Other Ambulatory Visit: Payer: Self-pay

## 2019-04-29 ENCOUNTER — Encounter: Payer: Self-pay | Admitting: Family Medicine

## 2019-04-29 VITALS — BP 128/78 | HR 95 | Temp 98.5°F | Resp 20 | Ht 73.27 in | Wt 227.2 lb

## 2019-04-29 DIAGNOSIS — K219 Gastro-esophageal reflux disease without esophagitis: Secondary | ICD-10-CM | POA: Diagnosis not present

## 2019-04-29 DIAGNOSIS — J3089 Other allergic rhinitis: Secondary | ICD-10-CM | POA: Diagnosis not present

## 2019-04-29 DIAGNOSIS — J4531 Mild persistent asthma with (acute) exacerbation: Secondary | ICD-10-CM | POA: Diagnosis not present

## 2019-04-29 MED ORDER — FLOVENT HFA 110 MCG/ACT IN AERO: 2.0000 | INHALATION_SPRAY | Freq: Two times a day (BID) | RESPIRATORY_TRACT | 5 refills | Status: DC

## 2019-04-29 NOTE — Patient Instructions (Addendum)
Asthma Begin Flovent 110-2 puffs twice a day with a spacer for 2 weeks or until cough and wheeze free Continue montelukast 10 mg once a day to prevent cough or wheeze Begin Mucinex [phone] mg twice a day and increase hydration to thin secretions For asthma flares, begin Flovent 110-2 puffs twice a day for 2 weeks or until cough and wheeze free, then stop Continue albuterol 2 puffs as needed for cough or wheeze You may use albuterol 2 puffs 5-15 minutes before activity to decrease cough or wheeze If you develop a fever/chills or if your symptoms worsen, call the clinic. We will consider chest xray if your symptoms worsen or so not improve  Allergic rhinitis Continue cetirizine 10 mg once a day as needed for a runny nose Continue azelastine nasal spray 2 sprays in each nostril twice a day as needed for nasal symptoms Consider saline nasal rinses as needed for nasal symptoms. Use this before any medicated nasal sprays for best result For thick post nasal drainage, begin Mucinex [phone] mg twice a day and increase hydration to thin secretions  Reflux Continue dietary and lifestyle modifications as listed below Continue esomeprazole 20 mg once a day to decrease reflux  Call the clinic if this treatment plan is not working well for you  Follow up in 1 month or sooner if needed.  Lifestyle Changes for Controlling GERD When you have GERD, stomach acid feels as if it's backing up toward your mouth. Whether or not you take medication to control your GERD, your symptoms can often be improved with lifestyle changes.   Raise Your Head  Reflux is more likely to strike when you're lying down flat, because stomach fluid can  flow backward more easily. Raising the head of your bed 4-6 inches can help. To do this:  Slide blocks or books under the legs at the head of your bed. Or, place a wedge under  the mattress. Many foam stores can make a suitable wedge for you. The wedge  should run from  your waist to the top of your head.  Don't just prop your head on several pillows. This increases pressure on your  stomach. It can make GERD worse.  Watch Your Eating Habits Certain foods may increase the acid in your stomach or relax the lower esophageal sphincter, making GERD more likely. It's best to avoid the following:  Coffee, tea, and carbonated drinks (with and without caffeine)  Fatty, fried, or spicy food  Mint, chocolate, onions, and tomatoes  Any other foods that seem to irritate your stomach or cause you pain  Relieve the Pressure  Eat smaller meals, even if you have to eat more often.  Don't lie down right after you eat. Wait a few hours for your stomach to empty.  Avoid tight belts and tight-fitting clothes.  Lose excess weight.  Tobacco and Alcohol  Avoid smoking tobacco and drinking alcohol. They can make GERD symptoms worse.

## 2019-04-29 NOTE — Progress Notes (Addendum)
[address] [place] Sellers [address] Dept: [phone]  FOLLOW UP NOTE  Patient ID: Isaiah Sellers, male    DOB: 02/22/1961  Age: 58 y.o. MRN: [id number] Date of Office Visit: 04/29/2019  Assessment  Chief Complaint: Asthma  HPI Isaiah Sellers is a 58 year old male who presents to the clinic for a follow up visit. He was last seen in this clinic on 08/26/2018 by Dr. Nunzio Cobbs for evaluation of asthma and allergic rhinitis. At today's visit, he reports feeling a tightness in his chest when the weather changes or there is a temperature variation. He reports a slight wheeze which is worse when lying down, intermittent dry cough, and feeling like there is " a frog" in his throat when the weather changes. He is currently using albuterol 5 times a week and Flovent 110 twice a week in addition to montelukast 10 mg once a day. He denies fever, chills, and sweats. He denies productive cough, discolored mucus production, and sick contacts. Allergic rhinitis is reported as well controlled with intermittent throat clearing for which he is using azelastine and fluticasone nasal sprays daily along with daily nasal saline rinses. Reflux is reported as well controlled with esomeprazole 20 mg about 2 days a week. He continues to avoid spicy foods. His current medications are listed in the chart.    Drug Allergies:  Allergies  Allergen Reactions  . Chocolate Rash    Physical Exam: BP 128/78   Pulse 95   Temp 98.5 F (36.9 C) (Oral)   Resp 20   Ht 6' 1.27" (1.861 m)   Wt 227 lb 3.2 oz (103.1 kg)   SpO2 97%   BMI 29.76 kg/m    Physical Exam Vitals signs reviewed.  Constitutional:      Appearance: Normal appearance.  HENT:     Head: Normocephalic and atraumatic.     Right Ear: Tympanic membrane normal.     Left Ear: Tympanic membrane normal.     Nose:     Comments: Bilateral nares slightly erythematous with clear nasal drainage noted. Pharynx normal. Ears normal. Eyes normal.  Mouth/Throat:     Pharynx: Oropharynx is clear.  Eyes:     Conjunctiva/sclera: Conjunctivae normal.  Neck:     Musculoskeletal: Normal range of motion and neck supple.  Cardiovascular:     Rate and Rhythm: Normal rate and regular rhythm.     Heart sounds: Normal heart sounds. No murmur.  Pulmonary:     Effort: Pulmonary effort is normal.     Comments: Rhonchi noted in the left lower lobe, otherwise clear to auscultation Musculoskeletal: Normal range of motion.  Skin:    General: Skin is warm and dry.  Neurological:     Mental Status: He is alert and oriented to person, place, and time.  Psychiatric:        Mood and Affect: Mood normal.        Behavior: Behavior normal.        Thought Content: Thought content normal.        Judgment: Judgment normal.    Diagnostics: FVC 4.37, FEV1 3.41. Predicted FVC 4.49, predicted FEV1 3.52. Spirometry indicates normal ventilatory function.   Assessment and Plan: 1. Mild persistent asthma with acute exacerbation   2. Other allergic rhinitis   3. Gastroesophageal reflux disease, unspecified whether esophagitis present     Meds ordered this encounter  Medications  . fluticasone (FLOVENT HFA) 110 MCG/ACT inhaler    Sig: Inhale 2 puffs  into the lungs 2 (two) times daily. For asthma flares. Rinse, gargle and spit out after use    Dispense:  1 Inhaler    Refill:  5    Patient Instructions  Asthma Begin Flovent 110-2 puffs twice a day with a spacer for 2 weeks or until cough and wheeze free Continue montelukast 10 mg once a day to prevent cough or wheeze Begin Mucinex [phone] mg twice a day and increase hydration to thin secretions For asthma flares, begin Flovent 110-2 puffs twice a day for 2 weeks or until cough and wheeze free, then stop Continue albuterol 2 puffs as needed for cough or wheeze You may use albuterol 2 puffs 5-15 minutes before activity to decrease cough or wheeze If you develop a fever/chills or if your symptoms worsen,  call the clinic. We will consider chest xray if your symptoms worsen or do not improve  Allergic rhinitis Continue cetirizine 10 mg once a day as needed for a runny nose Continue azelastine nasal spray 2 sprays in each nostril twice a day as needed for nasal symptoms Consider saline nasal rinses as needed for nasal symptoms. Use this before any medicated nasal sprays for best result For thick post nasal drainage, begin Mucinex [phone] mg twice a day and increase hydration to thin secretions  Reflux Continue dietary and lifestyle modifications as listed below Continue esomeprazole 20 mg once a day to decrease reflux  Call the clinic if this treatment plan is not working well for you  Follow up in 1 month or sooner if needed.   Return in about 4 weeks (around 05/27/2019), or if symptoms worsen or fail to improve.    Thank you for the opportunity to care for this patient.  Please do not hesitate to contact me with questions.  Gareth Morgan, FNP [facility]  ________________________________________________  I have provided oversight concerning [facility]'s evaluation and treatment of this patient's health issues addressed during today's encounter.  I agree with the assessment and therapeutic plan as outlined in the note.   Signed,   R Edgar Frisk, MD

## 2019-04-30 ENCOUNTER — Telehealth: Payer: Self-pay

## 2019-04-30 NOTE — Telephone Encounter (Signed)
Called pt, no answer. LVM for pt to call office

## 2019-04-30 NOTE — Telephone Encounter (Signed)
-----   Message from Dara Hoyer, FNP sent at 04/29/2019  3:25 PM EDT ----- Can you please call this patient and ask how he is breathing? If he has fever or productive cough. Please call me if he is having any cough or trouble breathing.  Thank you

## 2019-04-30 NOTE — Telephone Encounter (Signed)
Patient reports his cough is beginning to "loosen" and he is feeling better. He will call with worsening of symptoms.

## 2019-04-30 NOTE — Progress Notes (Signed)
Patient reports cough is beginning to "loosen" and he is feeling better. He will call with any worsening of symptoms.

## 2019-04-30 NOTE — Telephone Encounter (Signed)
Patient returning nurse call (per Gareth Morgan) this morning.  Would like to speak personally with Isaiah Sellers to discuss his progress.  Please call him at [phone].

## 2019-06-06 ENCOUNTER — Other Ambulatory Visit: Payer: Self-pay | Admitting: Allergy and Immunology

## 2019-09-01 ENCOUNTER — Other Ambulatory Visit: Payer: Self-pay

## 2019-09-01 MED ORDER — ALBUTEROL SULFATE HFA 108 (90 BASE) MCG/ACT IN AERS: INHALATION_SPRAY | RESPIRATORY_TRACT | 1 refills | Status: DC

## 2019-09-06 ENCOUNTER — Encounter: Payer: Self-pay | Admitting: Family Medicine

## 2019-09-06 ENCOUNTER — Ambulatory Visit: Payer: 59 | Admitting: Family Medicine

## 2019-09-06 ENCOUNTER — Other Ambulatory Visit: Payer: Self-pay

## 2019-09-06 ENCOUNTER — Ambulatory Visit: Payer: 59 | Attending: Internal Medicine

## 2019-09-06 VITALS — BP 110/70 | HR 94 | Temp 98.4°F | Resp 24

## 2019-09-06 DIAGNOSIS — J3089 Other allergic rhinitis: Secondary | ICD-10-CM | POA: Diagnosis not present

## 2019-09-06 DIAGNOSIS — R053 Chronic cough: Secondary | ICD-10-CM

## 2019-09-06 DIAGNOSIS — Z20822 Contact with and (suspected) exposure to covid-19: Secondary | ICD-10-CM

## 2019-09-06 DIAGNOSIS — K219 Gastro-esophageal reflux disease without esophagitis: Secondary | ICD-10-CM

## 2019-09-06 DIAGNOSIS — J4531 Mild persistent asthma with (acute) exacerbation: Secondary | ICD-10-CM | POA: Diagnosis not present

## 2019-09-06 DIAGNOSIS — R05 Cough: Secondary | ICD-10-CM | POA: Diagnosis not present

## 2019-09-06 DIAGNOSIS — Z9189 Other specified personal risk factors, not elsewhere classified: Secondary | ICD-10-CM

## 2019-09-06 MED ORDER — MONTELUKAST SODIUM 10 MG PO TABS: ORAL_TABLET | ORAL | 5 refills | Status: DC

## 2019-09-06 MED ORDER — BUDESONIDE-FORMOTEROL FUMARATE 160-4.5 MCG/ACT IN AERO: 2.0000 | INHALATION_SPRAY | Freq: Two times a day (BID) | RESPIRATORY_TRACT | 5 refills | Status: DC

## 2019-09-06 MED ORDER — CETIRIZINE HCL 10 MG PO TABS: 10.0000 mg | ORAL_TABLET | ORAL | 5 refills | Status: DC | PRN

## 2019-09-06 MED ORDER — ESOMEPRAZOLE MAGNESIUM 20 MG PO PACK: 20.0000 mg | PACK | Freq: Every day | ORAL | 5 refills | Status: DC

## 2019-09-06 MED ORDER — AZELASTINE HCL 0.1 % NA SOLN: NASAL | 5 refills | Status: DC

## 2019-09-06 NOTE — Patient Instructions (Addendum)
Asthma Restart montelukast 10 mg once a day to prevent cough or wheeze Begin Symbicort 160-2 puffs twice a day with a spacer to prevent coguh or wheeze Begin prednisone 10 mg tablets. Take 2 tablets twice a day for 3 days, then take 2 tablets once a day for 1 day, then take 1 tablet on the 5th day, then stop Continue albuterol 2 puffs as needed for cough or wheeze You may use albuterol 2 puffs 5-15 minutes before activity to decrease cough or wheeze If you develop a fever/chills or if your symptoms worsen, call the clinic. We will consider chest xray if your symptoms worsen or so not improve  Exposure to COVID19 A COVID19 test has been ordered. Isolate until you get a negative result  Allergic rhinitis Continue cetirizine 10 mg once a day as needed for a runny nose Continue azelastine nasal spray 2 sprays in each nostril twice a day as needed for nasal symptoms Consider saline nasal rinses as needed for nasal symptoms. Use this before any medicated nasal sprays for best result For thick post nasal drainage, begin Mucinex [phone] mg twice a day and increase hydration to thin secretions  Reflux Continue dietary and lifestyle modifications as listed below Continue esomeprazole 20 mg once a day to decrease reflux  Call the clinic if this treatment plan is not working well for you  Follow up in 2 weeks or sooner if needed.

## 2019-09-06 NOTE — Progress Notes (Signed)
[address] [place] [address] Dept: [phone]  FOLLOW UP NOTE  Patient ID: Isaiah Sellers, male    DOB: 1961/07/27  Age: 59 y.o. MRN: [id number] Date of Office Visit: 09/06/2019  Assessment  Chief Complaint: Asthma  HPI Isaiah Sellers is a 59 year old male who presents to the clinic for a follow up visit. He was last seen in this clinic on 04/29/2019 for evaluation of asthma, allergic rhinitis, and reflux. At today's visit, he reports that he began to experience a dry cough on Thursday when he began using his albuterol inhaler along with his Flovent 110 inhaler. The cough progressed and on Friday he was unable to use either inhaler. He reports shortness of breath which is worse at night and a slight wheeze. Prior to this incident, he has been using Flovent 110- as needed which is about 1-2 times a week, and albuterol about 1-2 times a week. He had also been taking montelukast 10 mg once a day, however, he ran out of this medication over 3 months ago. He reports decreased appetite, decreased energy, and feeling lethargic beginning on Friday. He denies fevers and is taking "a gel cap" medication for colds. Allergic rhinitis is reported as not well controlled with increased throat clearing. He is currently using saline nasal rinses daily and Flonase daily. Reflux is reported as well controlled with esomeprazole once a day. His current medications are liited in the chart.    Drug Allergies:  Allergies  Allergen Reactions  . Chocolate Rash    Physical Exam: BP 110/70   Pulse 94   Temp 98.4 F (36.9 C) (Oral)   Resp (!) 24   SpO2 96%    Physical Exam Vitals reviewed.  Constitutional:      Appearance: Normal appearance.  HENT:     Head: Normocephalic and atraumatic.     Right Ear: Tympanic membrane normal.     Left Ear: Tympanic membrane normal.     Nose:     Comments: Bilateral nares erythematous with clear nasal drainage noted. Pharynx edematous with no exudate  noted. Ears normal. Eyes normal.    Mouth/Throat:     Pharynx: Oropharynx is clear.  Eyes:     Conjunctiva/sclera: Conjunctivae normal.  Cardiovascular:     Rate and Rhythm: Normal rate and regular rhythm.     Heart sounds: Normal heart sounds. No murmur.  Pulmonary:     Effort: Pulmonary effort is normal.     Breath sounds: Normal breath sounds.     Comments: Slightly tachypnic Musculoskeletal:        General: Normal range of motion.     Cervical back: Normal range of motion and neck supple.  Skin:    General: Skin is warm and dry.  Neurological:     Mental Status: He is alert and oriented to person, place, and time.  Psychiatric:        Mood and Affect: Mood normal.        Behavior: Behavior normal.        Thought Content: Thought content normal.        Judgment: Judgment normal.     Diagnostics: FVC 3.25, FEV1 2.34. Predicted FVC 4.49, predicted FEV1 3.52. Spirometry indicates mild restriction.    Assessment and Plan: 1. Mild persistent asthma with acute exacerbation   2. At increased risk of exposure to COVID-19 virus   3. Cough, persistent   4. Other allergic rhinitis   5. Gastroesophageal reflux disease, unspecified  whether esophagitis present     Meds ordered this encounter  Medications  . montelukast (SINGULAIR) 10 MG tablet    Sig: 1 tablet once at night for coughing or wheezing.    Dispense:  34 tablet    Refill:  5  . budesonide-formoterol (SYMBICORT) 160-4.5 MCG/ACT inhaler    Sig: Inhale 2 puffs into the lungs 2 (two) times daily. Use with spacer.Rinse, gargle and spit out after use    Dispense:  1 Inhaler    Refill:  5  . cetirizine (ZYRTEC) 10 MG tablet    Sig: Take 1 tablet (10 mg total) by mouth as needed.    Dispense:  30 tablet    Refill:  5  . azelastine (ASTELIN) 0.1 % nasal spray    Sig: 1-2 sprays per nostril 2 times daily as needed    Dispense:  30 mL    Refill:  5  . esomeprazole (NEXIUM) 20 MG packet    Sig: Take 20 mg by mouth daily  before breakfast.    Dispense:  30 each    Refill:  5    Patient Instructions  Asthma Restart montelukast 10 mg once a day to prevent cough or wheeze Begin Symbicort 160-2 puffs twice a day with a spacer to prevent coguh or wheeze Begin prednisone 10 mg tablets. Take 2 tablets twice a day for 3 days, then take 2 tablets once a day for 1 day, then take 1 tablet on the 5th day, then stop Continue albuterol 2 puffs as needed for cough or wheeze You may use albuterol 2 puffs 5-15 minutes before activity to decrease cough or wheeze If you develop a fever/chills or if your symptoms worsen, call the clinic. We will consider chest xray if your symptoms worsen or so not improve  Exposure to COVID19 A COVID19 test has been ordered. Isolate until you get a negative result  Allergic rhinitis Continue cetirizine 10 mg once a day as needed for a runny nose Continue azelastine nasal spray 2 sprays in each nostril twice a day as needed for nasal symptoms Consider saline nasal rinses as needed for nasal symptoms. Use this before any medicated nasal sprays for best result For thick post nasal drainage, begin Mucinex [phone] mg twice a day and increase hydration to thin secretions  Reflux Continue dietary and lifestyle modifications as listed below Continue esomeprazole 20 mg once a day to decrease reflux  Call the clinic if this treatment plan is not working well for you  Follow up in 2 weeks or sooner if needed.   Return in about 2 weeks (around 09/20/2019), or if symptoms worsen or fail to improve.   Thank you for the opportunity to care for this patient.  Please do not hesitate to contact me with questions.  Thermon Leyland, FNP [facility]  I have provided oversight concerning Thermon Leyland' evaluation and treatment of this patient's health issues addressed during today's encounter. I agree with the assessment and therapeutic plan as outlined  in the note.   Thank you for the opportunity to care for this patient.  Please do not hesitate to contact me with questions.  Tonette Bihari, M.D.  [facility] [address] [place], [place] [address] [phone]

## 2019-09-07 ENCOUNTER — Telehealth: Payer: Self-pay

## 2019-09-07 LAB — NOVEL CORONAVIRUS, NAA: SARS-CoV-2, NAA: DETECTED — AB

## 2019-09-07 NOTE — Telephone Encounter (Signed)
Since he is better with the use of prednisone, he will continue on the treatment plan that we outlined yesterday.  He had Covid testing yesterday but the results are not available.  We will call him tomorrow and see how he is doing

## 2019-09-07 NOTE — Telephone Encounter (Signed)
Per Dr. Beaulah Dinning,  Call patient and see how he is doing with his asthma.  Patient was seen yesterday in clinic for cough and wheeze. Patient states medications are helping.  He still has some cough, wheeze and shortness of breath.  He feels he is some improved with the prednisone he is taking currently.  Patient is on all medications as instructed at yesterdays OV.  Patient wanted to thank Dr. Beaulah Dinning for checking in on him today. Routed this note to Dr. Beaulah Dinning.

## 2019-09-08 ENCOUNTER — Encounter (HOSPITAL_COMMUNITY): Payer: Self-pay | Admitting: Emergency Medicine

## 2019-09-08 ENCOUNTER — Telehealth: Payer: Self-pay | Admitting: Pulmonary Disease

## 2019-09-08 ENCOUNTER — Inpatient Hospital Stay (HOSPITAL_COMMUNITY)
Admission: EM | Admit: 2019-09-08 | Discharge: 2019-09-12 | DRG: 871 | Disposition: A | Discharge status: Home / Self Care | Payer: 59 | Attending: Internal Medicine | Admitting: Internal Medicine

## 2019-09-08 ENCOUNTER — Other Ambulatory Visit: Payer: Self-pay

## 2019-09-08 ENCOUNTER — Telehealth: Payer: Self-pay

## 2019-09-08 ENCOUNTER — Emergency Department (HOSPITAL_COMMUNITY): Payer: 59

## 2019-09-08 DIAGNOSIS — J42 Unspecified chronic bronchitis: Secondary | ICD-10-CM | POA: Diagnosis present

## 2019-09-08 DIAGNOSIS — R0603 Acute respiratory distress: Secondary | ICD-10-CM | POA: Diagnosis present

## 2019-09-08 DIAGNOSIS — I10 Essential (primary) hypertension: Secondary | ICD-10-CM | POA: Diagnosis present

## 2019-09-08 DIAGNOSIS — Z79899 Other long term (current) drug therapy: Secondary | ICD-10-CM | POA: Diagnosis not present

## 2019-09-08 DIAGNOSIS — J1282 Pneumonia due to coronavirus disease 2019: Secondary | ICD-10-CM | POA: Diagnosis present

## 2019-09-08 DIAGNOSIS — Z7951 Long term (current) use of inhaled steroids: Secondary | ICD-10-CM

## 2019-09-08 DIAGNOSIS — A419 Sepsis, unspecified organism: Secondary | ICD-10-CM | POA: Diagnosis not present

## 2019-09-08 DIAGNOSIS — K219 Gastro-esophageal reflux disease without esophagitis: Secondary | ICD-10-CM | POA: Diagnosis present

## 2019-09-08 DIAGNOSIS — U071 COVID-19: Secondary | ICD-10-CM | POA: Diagnosis present

## 2019-09-08 DIAGNOSIS — A4189 Other specified sepsis: Secondary | ICD-10-CM | POA: Diagnosis present

## 2019-09-08 DIAGNOSIS — J4530 Mild persistent asthma, uncomplicated: Secondary | ICD-10-CM | POA: Diagnosis present

## 2019-09-08 LAB — COMPREHENSIVE METABOLIC PANEL
ALT: 29 U/L (ref 0–44)
AST: 42 U/L — ABNORMAL HIGH (ref 15–41)
Albumin: 3.1 g/dL — ABNORMAL LOW (ref 3.5–5.0)
Alkaline Phosphatase: 65 U/L (ref 38–126)
Anion gap: 12 (ref 5–15)
BUN: 13 mg/dL (ref 6–20)
CO2: 20 mmol/L — ABNORMAL LOW (ref 22–32)
Calcium: 8.5 mg/dL — ABNORMAL LOW (ref 8.9–10.3)
Chloride: 105 mmol/L (ref 98–111)
Creatinine, Ser: 1.09 mg/dL (ref 0.61–1.24)
GFR calc Af Amer: >60 mL/min (ref >60)
GFR calc non Af Amer: >60 mL/min (ref >60)
Glucose, Bld: 132 mg/dL — ABNORMAL HIGH (ref 70–99)
Potassium: 4.4 mmol/L (ref 3.5–5.1)
Sodium: 137 mmol/L (ref 135–145)
Total Bilirubin: 0.9 mg/dL (ref 0.3–1.2)
Total Protein: 6.9 g/dL (ref 6.5–8.1)

## 2019-09-08 LAB — CBC WITH DIFFERENTIAL/PLATELET
Abs Immature Granulocytes: 0.07 10*3/uL (ref 0.00–0.07)
Basophils Absolute: 0 10*3/uL (ref 0.0–0.1)
Basophils Relative: 0 %
Eosinophils Absolute: 0 10*3/uL (ref 0.0–0.5)
Eosinophils Relative: 0 %
HCT: 40.5 % (ref 39.0–52.0)
Hemoglobin: 13.4 g/dL (ref 13.0–17.0)
Immature Granulocytes: 1 %
Lymphocytes Relative: 10 %
Lymphs Abs: 1.1 10*3/uL (ref 0.7–4.0)
MCH: 29.8 pg (ref 26.0–34.0)
MCHC: 33.1 g/dL (ref 30.0–36.0)
MCV: 90.2 fL (ref 80.0–100.0)
Monocytes Absolute: 0.3 10*3/uL (ref 0.1–1.0)
Monocytes Relative: 3 %
Neutro Abs: 9.4 10*3/uL — ABNORMAL HIGH (ref 1.7–7.7)
Neutrophils Relative %: 86 %
Platelets: 170 10*3/uL (ref 150–400)
RBC: 4.49 MIL/uL (ref 4.22–5.81)
RDW: 13.7 % (ref 11.5–15.5)
WBC: 10.9 10*3/uL — ABNORMAL HIGH (ref 4.0–10.5)
nRBC: 0 % (ref 0.0–0.2)

## 2019-09-08 LAB — LACTIC ACID, PLASMA
Lactic Acid, Venous: 2.8 mmol/L (ref 0.5–1.9)
Lactic Acid, Venous: 2.1 mmol/L (ref 0.5–1.9)
Lactic Acid, Venous: 1.5 mmol/L (ref 0.5–1.9)

## 2019-09-08 LAB — HIV ANTIBODY (ROUTINE TESTING W REFLEX): HIV Screen 4th Generation wRfx: NONREACTIVE

## 2019-09-08 LAB — TRIGLYCERIDES: Triglycerides: 106 mg/dL (ref <150)

## 2019-09-08 LAB — PROCALCITONIN: Procalcitonin: 0.3 ng/mL

## 2019-09-08 LAB — D-DIMER, QUANTITATIVE: D-Dimer, Quant: 1.21 ug/mL-FEU — ABNORMAL HIGH (ref 0.00–0.50)

## 2019-09-08 LAB — FERRITIN: Ferritin: 224 ng/mL (ref 24–336)

## 2019-09-08 LAB — FIBRINOGEN: Fibrinogen: 592 mg/dL — ABNORMAL HIGH (ref 210–475)

## 2019-09-08 LAB — ABO/RH: ABO/RH(D): O POS

## 2019-09-08 LAB — LACTATE DEHYDROGENASE: LDH: 323 U/L — ABNORMAL HIGH (ref 98–192)

## 2019-09-08 LAB — C-REACTIVE PROTEIN: CRP: 11.2 mg/dL — ABNORMAL HIGH (ref <1.0)

## 2019-09-08 MED ORDER — AMLODIPINE BESYLATE 10 MG PO TABS
10.0000 mg | ORAL_TABLET | Freq: Every day | ORAL | Status: DC
  Administered 2019-09-08 – 2019-09-12 (×5): 10 mg via ORAL
  Filled 2019-09-08 (×2): qty 1
  Filled 2019-09-08: qty 2
  Filled 2019-09-08 (×2): qty 1

## 2019-09-08 MED ORDER — FLUTICASONE PROPIONATE 50 MCG/ACT NA SUSP
2.0000 | Freq: Every day | NASAL | Status: DC
  Administered 2019-09-09 – 2019-09-12 (×4): 2 via NASAL
  Filled 2019-09-08: qty 16

## 2019-09-08 MED ORDER — SODIUM CHLORIDE 0.9% FLUSH
3.0000 mL | Freq: Two times a day (BID) | INTRAVENOUS | Status: DC
  Administered 2019-09-08 – 2019-09-12 (×8): 3 mL via INTRAVENOUS

## 2019-09-08 MED ORDER — GUAIFENESIN-DM 100-10 MG/5ML PO SYRP
10.0000 mL | ORAL_SOLUTION | ORAL | Status: DC | PRN
  Administered 2019-09-08 – 2019-09-11 (×3): 10 mL via ORAL
  Filled 2019-09-08 (×3): qty 10

## 2019-09-08 MED ORDER — HYDROCOD POLST-CPM POLST ER 10-8 MG/5ML PO SUER
5.0000 mL | Freq: Two times a day (BID) | ORAL | Status: DC | PRN
  Administered 2019-09-08 – 2019-09-11 (×2): 5 mL via ORAL
  Filled 2019-09-08 (×2): qty 5

## 2019-09-08 MED ORDER — ACETAMINOPHEN 325 MG PO TABS
650.0000 mg | ORAL_TABLET | Freq: Once | ORAL | Status: AC
  Administered 2019-09-08: 10:00:00 650 mg via ORAL
  Filled 2019-09-08: qty 2

## 2019-09-08 MED ORDER — SODIUM CHLORIDE 0.9 % IV SOLN: Freq: Once | INTRAVENOUS | Status: AC

## 2019-09-08 MED ORDER — AEROCHAMBER PLUS FLO-VU LARGE MISC
Status: AC
  Filled 2019-09-08: qty 1

## 2019-09-08 MED ORDER — ENOXAPARIN SODIUM 40 MG/0.4ML ~~LOC~~ SOLN
40.0000 mg | SUBCUTANEOUS | Status: DC
  Administered 2019-09-08 – 2019-09-11 (×4): 40 mg via SUBCUTANEOUS
  Filled 2019-09-08 (×4): qty 0.4

## 2019-09-08 MED ORDER — SODIUM CHLORIDE 0.9 % IV SOLN
100.0000 mg | Freq: Every day | INTRAVENOUS | Status: AC
  Administered 2019-09-09 – 2019-09-12 (×4): 100 mg via INTRAVENOUS
  Filled 2019-09-08 (×4): qty 20

## 2019-09-08 MED ORDER — ONDANSETRON HCL 4 MG/2ML IJ SOLN: 4.0000 mg | Freq: Four times a day (QID) | INTRAMUSCULAR | Status: DC | PRN

## 2019-09-08 MED ORDER — SODIUM CHLORIDE 0.9 % IV SOLN
200.0000 mg | Freq: Once | INTRAVENOUS | Status: AC
  Administered 2019-09-08: 14:00:00 200 mg via INTRAVENOUS
  Filled 2019-09-08: qty 40

## 2019-09-08 MED ORDER — DEXAMETHASONE 6 MG PO TABS
6.0000 mg | ORAL_TABLET | ORAL | Status: DC
  Administered 2019-09-09: 06:00:00 6 mg via ORAL
  Filled 2019-09-08: qty 1

## 2019-09-08 MED ORDER — FLUTICASONE PROPIONATE HFA 110 MCG/ACT IN AERO
2.0000 | INHALATION_SPRAY | Freq: Two times a day (BID) | RESPIRATORY_TRACT | Status: DC
  Administered 2019-09-08 – 2019-09-09 (×2): 2 via RESPIRATORY_TRACT
  Filled 2019-09-08: qty 12

## 2019-09-08 MED ORDER — LOSARTAN POTASSIUM 50 MG PO TABS
50.0000 mg | ORAL_TABLET | Freq: Every day | ORAL | Status: DC
  Administered 2019-09-08 – 2019-09-12 (×5): 50 mg via ORAL
  Filled 2019-09-08 (×5): qty 1

## 2019-09-08 MED ORDER — LORATADINE 10 MG PO TABS
10.0000 mg | ORAL_TABLET | Freq: Every day | ORAL | Status: DC
  Administered 2019-09-08 – 2019-09-12 (×6): 10 mg via ORAL
  Filled 2019-09-08 (×6): qty 1

## 2019-09-08 MED ORDER — ZINC SULFATE 220 (50 ZN) MG PO CAPS
220.0000 mg | ORAL_CAPSULE | Freq: Every day | ORAL | Status: DC
  Administered 2019-09-08 – 2019-09-12 (×5): 220 mg via ORAL
  Filled 2019-09-08 (×6): qty 1

## 2019-09-08 MED ORDER — FAMOTIDINE 20 MG PO TABS
20.0000 mg | ORAL_TABLET | Freq: Two times a day (BID) | ORAL | Status: DC
  Administered 2019-09-08 – 2019-09-12 (×9): 20 mg via ORAL
  Filled 2019-09-08 (×8): qty 1

## 2019-09-08 MED ORDER — SODIUM CHLORIDE 0.9 % IV SOLN
500.0000 mg | INTRAVENOUS | Status: DC
  Administered 2019-09-08 – 2019-09-11 (×4): 500 mg via INTRAVENOUS
  Filled 2019-09-08 (×6): qty 500

## 2019-09-08 MED ORDER — ONDANSETRON HCL 4 MG PO TABS: 4.0000 mg | ORAL_TABLET | Freq: Four times a day (QID) | ORAL | Status: DC | PRN

## 2019-09-08 MED ORDER — ACETAMINOPHEN 325 MG PO TABS
650.0000 mg | ORAL_TABLET | Freq: Four times a day (QID) | ORAL | Status: DC | PRN
  Administered 2019-09-08: 15:00:00 650 mg via ORAL
  Filled 2019-09-08: qty 2

## 2019-09-08 MED ORDER — ASCORBIC ACID 500 MG PO TABS
500.0000 mg | ORAL_TABLET | Freq: Every day | ORAL | Status: DC
  Administered 2019-09-08 – 2019-09-12 (×5): 500 mg via ORAL
  Filled 2019-09-08 (×5): qty 1

## 2019-09-08 MED ORDER — SODIUM CHLORIDE 0.9 % IV SOLN
2.0000 g | INTRAVENOUS | Status: DC
  Administered 2019-09-08 – 2019-09-11 (×4): 2 g via INTRAVENOUS
  Filled 2019-09-08 (×4): qty 20

## 2019-09-08 MED ORDER — ALBUTEROL SULFATE HFA 108 (90 BASE) MCG/ACT IN AERS
2.0000 | INHALATION_SPRAY | Freq: Four times a day (QID) | RESPIRATORY_TRACT | Status: DC
  Administered 2019-09-08 – 2019-09-12 (×16): 2 via RESPIRATORY_TRACT
  Filled 2019-09-08 (×2): qty 6.7

## 2019-09-08 MED ORDER — MONTELUKAST SODIUM 10 MG PO TABS
10.0000 mg | ORAL_TABLET | Freq: Every day | ORAL | Status: DC
  Administered 2019-09-08 – 2019-09-11 (×4): 10 mg via ORAL
  Filled 2019-09-08 (×5): qty 1

## 2019-09-08 NOTE — ED Notes (Signed)
Report called to [facility]. 

## 2019-09-08 NOTE — H&P (Signed)
History and Physical    WARDEN BUFFA FXT:[id number] DOB: [birth year] DOA: 09/08/2019  Referring MD/NP/PA: Krista Blue, PA-C PCP: Kristopher Glee., MD  Patient coming from: Home  Chief Complaint: Shortness of breath  I have personally briefly reviewed patient's old medical records in [place]   HPI: Isaiah Sellers is a 59 y.o. male with medical history significant of hypertension, asthma, chronic bronchitis, and reflux.  Patient presents with complaints of cough and progressively worsening shortness of breath over the last 6 days.  He was diagnosed with COVID-19 on 2/8.  He had been prescribed a prednisone taper forwhich he had taken 2 days worth of medication by his allergist Dr. Shaune Leeks.  He reports being unable to rest at night due to his symptoms.  Other associated symptoms include some mild back discomfort, chills, decreased appetite, and some chest discomfort when coughing.  Denies having any fever, headache, change in taste/smell, nausea, vomiting, or diarrhea symptoms.   ED Course: Upon admission into the emergency department patient was noted to be febrile up to 101.7 F, pulse 110-120, respiration 22-28, blood pressure stable, and O2 saturation maintained on room air.  Labs significant for WBC 10.9, AST 42, LDH 323, CRP 11.2, lactic acid 2.8, D-dimer 1.21, fibrinogen 592, and pro calcitonin 0.3.  Chest x-ray was limited due to hypoinflated.  He had been given Tylenol for fever symptoms and 1 L of normal saline IV fluids.  TRH called to admit.  Review of Systems  Constitutional: Positive for malaise/fatigue. Negative for fever.  HENT: Negative for congestion and ear discharge.   Eyes: Negative for photophobia and pain.  Respiratory: Positive for cough, shortness of breath and wheezing.   Cardiovascular: Negative for leg swelling.  Gastrointestinal: Negative for diarrhea, nausea and vomiting.  Genitourinary: Negative for dysuria and hematuria.  Musculoskeletal:  Negative for back pain and falls.  Skin: Negative for rash.  Neurological: Negative for loss of consciousness and headaches.  Psychiatric/Behavioral: Negative for substance abuse. The patient has insomnia.     Past Medical History:  Diagnosis Date  . Asthma   . Chronic bronchitis ([place])   . Deviated septum   . Hypertension   . Nasal turbinate hypertrophy     Past Surgical History:  Procedure Laterality Date  . ENDOSCOPIC CONCHA BULLOSA RESECTION Right 08/18/2018   Procedure: ENDOSCOPIC RIGHT CONCHA BULLOSA RESECTION;  Surgeon: Leta Baptist, MD;  Location: [facility];  Service: ENT;  Laterality: Right;  . ETHMOIDECTOMY Bilateral 08/18/2018   Procedure: TOTAL ETHMOIDECTOMY AND FRONTAL RECESS EXPLORATION;  Surgeon: Leta Baptist, MD;  Location: [facility];  Service: ENT;  Laterality: Bilateral;  . MAXILLARY ANTROSTOMY Bilateral 08/18/2018   Procedure: ENDOSCOPIC MAXILLARY ANTROSTOMY WITH TISSUE REMOVAL;  Surgeon: Leta Baptist, MD;  Location: [facility];  Service: ENT;  Laterality: Bilateral;  . NASAL SEPTOPLASTY W/ TURBINOPLASTY Bilateral 08/18/2018   Procedure: NASAL SEPTOPLASTY WITH BILATERAL TURBINATE REDUCTION;  Surgeon: Leta Baptist, MD;  Location: [facility];  Service: ENT;  Laterality: Bilateral;  . SINUS ENDO WITH FUSION Bilateral 08/18/2018   Procedure: SINUS ENDOSCOPY WITH FUSION NAVIGATION;  Surgeon: Leta Baptist, MD;  Location: [facility];  Service: ENT;  Laterality: Bilateral;     reports that he has never smoked. He has never used smokeless tobacco. He reports current alcohol use of about 2.0 standard drinks of alcohol per week. He reports that he does not use drugs.  Allergies  Allergen Reactions  . Chocolate Rash  .  Other Rash    All carbonated drinks (soda)    Family History  Problem Relation Age of Onset  . Angioedema Neg Hx   . Allergic rhinitis Neg Hx   . Asthma Neg Hx   . Immunodeficiency Neg Hx   . Eczema  Neg Hx   . Urticaria Neg Hx     Prior to Admission medications   Medication Sig Start Date End Date Taking? Authorizing Provider  albuterol (VENTOLIN HFA) 108 (90 Base) MCG/ACT inhaler INHALE 2 PUFFS INTO THE LUNGS EVERY 4 HOURS AS NEEDED FOR WHEEZING OR SHORTNESS OF BREATH 09/01/19  Yes Ambs, Norvel Richards, FNP  amLODipine (NORVASC) 10 MG tablet Take 10 mg by mouth daily.  11/18/15  Yes [provider]  azelastine (ASTELIN) 0.1 % nasal spray 1-2 sprays per nostril 2 times daily as needed 09/06/19  Yes Ambs, Norvel Richards, FNP  budesonide-formoterol (SYMBICORT) 160-4.5 MCG/ACT inhaler Inhale 2 puffs into the lungs 2 (two) times daily. Use with spacer.Rinse, gargle and spit out after use 09/06/19  Yes Ambs, Norvel Richards, FNP  cetirizine (ZYRTEC) 10 MG tablet Take 1 tablet (10 mg total) by mouth as needed. Patient taking differently: Take 10 mg by mouth daily as needed for allergies.  09/06/19  Yes Ambs, Norvel Richards, FNP  Cholecalciferol (VITAMIN D PO) Take 1,000 Units by mouth daily.    Yes [provider]  clindamycin (CLEOCIN T) 1 % lotion Apply 1 application topically as needed (for skin (acne)).  08/12/17  Yes [provider]  doxycycline (VIBRAMYCIN) 100 MG capsule Take 100 mg by mouth 2 (two) times daily as needed (skin irritation due to allergies).  02/11/18  Yes [provider]  esomeprazole (NEXIUM) 20 MG packet Take 20 mg by mouth daily before breakfast. 09/06/19  Yes Ambs, Norvel Richards, FNP  fluocinonide (LIDEX) 0.05 % external solution Apply 1 application topically daily as needed (for scalp irritation).  11/18/16  Yes [provider]  fluticasone (FLONASE) 50 MCG/ACT nasal spray Place 2 sprays into both nostrils daily. 10/05/18  Yes Ambs, Norvel Richards, FNP  fluticasone (FLOVENT HFA) 110 MCG/ACT inhaler Inhale 2 puffs into the lungs 2 (two) times daily. For asthma flares. Rinse, gargle and spit out after use 04/29/19  Yes Ambs, Norvel Richards, FNP  ibuprofen (ADVIL) 200 MG tablet Take 400 mg by mouth  every 6 (six) hours as needed for moderate pain.   Yes [provider]  losartan (COZAAR) 50 MG tablet Take 50 mg by mouth daily.    Yes [provider]  montelukast (SINGULAIR) 10 MG tablet 1 tablet once at night for coughing or wheezing. 09/06/19  Yes Ambs, Norvel Richards, FNP  Multiple Vitamins-Minerals (DAILY MULTIVITAMIN PO) Take 1 tablet by mouth daily.    Yes [provider]  Spacer/Aero-Holding Rudean Curt Use as directed with inhaler 11/26/18  Yes Bobbitt, Heywood Iles, MD    Physical Exam:  Constitutional: Middle-age male who appears to be in some respiratory distress Vitals:   09/08/19 0912 09/08/19 1023 09/08/19 1025  BP: 133/66  119/60  Pulse: (!) 120  (!) 110  Resp: (!) 22  (!) 28  Temp: 99.6 F (37.6 C)  (S) (!) 101.7 F (38.7 C)  TempSrc: Oral  (S) Oral  SpO2: 98%  93%  Weight:  102.1 kg   Height:  6\' 1"  (1.854 m)    Eyes: PERRL, lids and conjunctivae normal ENMT: Mucous membranes are moist. Posterior pharynx clear of any exudate or lesions. Normal dentition.  Neck: normal, supple, no masses, no thyromegaly Respiratory: clear to auscultation bilaterally, no wheezing, no crackles. Normal respiratory effort. No accessory muscle use.  Cardiovascular: Mildly decreased aeration with no significant wheezes noted at this time.  No significant rhonchi rales appreciated. Abdomen: no tenderness, no masses palpated. No hepatosplenomegaly. Bowel sounds positive.  Musculoskeletal: no clubbing / cyanosis. No joint deformity upper and lower extremities. Good ROM, no contractures. Normal muscle tone.  Skin: no rashes, lesions, ulcers. No induration Neurologic: CN 2-12 grossly intact. Sensation intact, DTR normal. Strength 5/5 in all 4.  Psychiatric: Normal judgment and insight. Alert and oriented x 3. Normal mood.     Labs on Admission: I have personally reviewed following labs and imaging studies  CBC: Recent Labs  Lab 09/08/19 1015  WBC 10.9*  NEUTROABS  9.4*  HGB 13.4  HCT 40.5  MCV 90.2  PLT 170   Basic Metabolic Panel: Recent Labs  Lab 09/08/19 1015  NA 137  K 4.4  CL 105  CO2 20*  GLUCOSE 132*  BUN 13  CREATININE 1.09  CALCIUM 8.5*   GFR: Estimated Creatinine Clearance: 92.8 mL/min (by C-G formula based on SCr of 1.09 mg/dL). Liver Function Tests: Recent Labs  Lab 09/08/19 1015  AST 42*  ALT 29  ALKPHOS 65  BILITOT 0.9  PROT 6.9  ALBUMIN 3.1*   No results for input(s): LIPASE, AMYLASE in the last 168 hours. No results for input(s): AMMONIA in the last 168 hours. Coagulation Profile: No results for input(s): INR, PROTIME in the last 168 hours. Cardiac Enzymes: No results for input(s): CKTOTAL, CKMB, CKMBINDEX, TROPONINI in the last 168 hours. BNP (last 3 results) No results for input(s): PROBNP in the last 8760 hours. HbA1C: No results for input(s): HGBA1C in the last 72 hours. CBG: No results for input(s): GLUCAP in the last 168 hours. Lipid Profile: Recent Labs    09/08/19 1015  TRIG 106   Thyroid Function Tests: No results for input(s): TSH, T4TOTAL, FREET4, T3FREE, THYROIDAB in the last 72 hours. Anemia Panel: Recent Labs    09/08/19 1015  FERRITIN 224   Urine analysis: No results found for: COLORURINE, APPEARANCEUR, LABSPEC, PHURINE, GLUCOSEU, HGBUR, BILIRUBINUR, KETONESUR, PROTEINUR, UROBILINOGEN, NITRITE, LEUKOCYTESUR Sepsis Labs: Recent Results (from the past 240 hour(s))  Novel Coronavirus, NAA ([facility])     Status: Abnormal   Collection Time: 09/06/19  2:31 PM   Specimen: Nasopharyngeal(NP) swabs in vial transport medium   NASOPHARYNGE  TESTING  Result Value Ref Range Status   SARS-CoV-2, NAA Detected (A) Not Detected Final    Comment: This nucleic acid amplification test was developed and its performance characteristics determined by [employer]. Nucleic acid amplification tests include RT-PCR and TMA. This test has not been FDA cleared or approved. This test has been  authorized by FDA under an Emergency Use Authorization (EUA). This test is only authorized for the duration of time the declaration that circumstances exist justifying the authorization of the emergency use of in vitro diagnostic tests for detection of SARS-CoV-2 virus and/or diagnosis of COVID-19 infection under section 564(b)(1) of the Act, 21 U.S.C. 829FAO-1(H) (1), unless the authorization is terminated or revoked sooner. When diagnostic testing is negative, the possibility of a false negative result should be considered in the context of a patient's recent exposures and the presence of clinical signs and symptoms consistent with COVID-19. An individual without symptoms of COVID-19 and who is not shedding SARS-CoV-2 virus wo uld expect to have a negative (not detected) result in  this assay.      Radiological Exams on Admission: DG Chest Port 1 View  Result Date: 09/08/2019 CLINICAL DATA:  Shortness of breath.  COVID-19 positive EXAM: PORTABLE CHEST 1 VIEW COMPARISON:  Dec 14, 2013. FINDINGS: Degree of inspiration is shallow. There is slight right base atelectasis. Lungs elsewhere clear. Heart size and pulmonary vascularity are normal. No adenopathy. No bone lesions. IMPRESSION: Shallow degree of inspiration. A degree of underlying restrictive disease cannot be excluded. Slight right base atelectasis. Lungs elsewhere clear. Cardiac silhouette normal. Electronically Signed   By: Bretta Bang III M.D.   On: 09/08/2019 10:12    Chest x-ray: Independently reviewed.  Shallow inspiratory effort.  Assessment/Plan Acute respiratory distress secondary to COVID-19: Patient reports worsening shortness of breath after diagnosis of COVID-19 on 2/4.  He is able to maintain O2 saturations on room air currently at this time.  Chest x-ray limited due to shallow inspiration.  Labs significant for procalcitonin 0.3, CRP 11.2, lactic acid 2.8, D-dimer -Admit to the medical telemetry bed -COVID-19  order set utilized -On nasal cannula oxygen as needed for O2 saturations less than 90% -Albuterol inhaler -Remdesivir per pharmacy -Decadron 6 mg -Empiric antibiotics of Rocephin and azithromycin -Pharmacy and zinc -Tylenol as needed for fever -Continue to monitor inflammatory markers daily  Sepsis: Secondary to above.  -Trend lactic acid level -Follow-up blood culture -Empiric antibiotics of Rocephin and azithromycin  Mild persistent asthma: Patient without wheezing currently noted on physical exam at this time.   -Continue Flovent inhaler  Essential hypertension: Blood pressures on admission appear to be stable. Home medications include amlodipine 10 mg daily and losartan 50 mg daily. -Continue current home blood pressure regimen  Elevated AST: AST mildly elevated at 42 on admission. -Continue to monitor  GERD: Patient on Nexium at home. -Pepcid    DVT prophylaxis: Lovenox Code Status: Full Family Communication: Wife updated over the phone Disposition Plan: Likely discharge home once medically stable Consults called: None Admission status: Inpatient  Clydie Braun MD [facility] Pager [phone]   If 7PM-7AM, please contact night-coverage [email or web address] Password [email or web address]  09/08/2019, 1:27 PM

## 2019-09-08 NOTE — Telephone Encounter (Signed)
09/08/2019    I attempted to reach the patient regarding recent Covid diagnosis as well as opportunity for monoclonal antibody infusion.  It does look like the patient would qualify based off of Age and HTN.  I have left a message with the telephone number: [phone] for the patient to call us back if they are interested.    Coral Ceo, NP

## 2019-09-08 NOTE — Telephone Encounter (Signed)
Ambs, Norvel Richards, FNP  [facility]  Please call this patient and ask how his breathing is. Please ask if he has any questions about the covid testing result. Thank you    Tried calling to with no answer lm for pt to call us back

## 2019-09-08 NOTE — Progress Notes (Signed)
ZEKIAH CARUTH [id number] Admission Data: 09/08/2019 5:58 PM Attending Provider: Clydie Braun, MD  FMB:[id number], Diana Eves., MD Consults/ Treatment Team:   RAMEZ ARRONA is a 59 y.o. male patient admitted from ED awake, alert  & orientated  X 3,  Full Code, VSS - Blood pressure (!) 147/92, pulse (!) 123, temperature (!) 102.3 F (39.1 C), temperature source Oral, resp. rate (!) 21, height 6\' 1"  (1.854 m), weight 102.1 kg, SpO2 95 %., O2  2 L nasal cannular, no c/o shortness of breath, no c/o chest pain, no distress noted. Tele #  18 placed and pt is currently running:sinus tachycardia.   IV site WDL:  forearm right, condition patent and no redness with a transparent dsg that's clean dry and intact.  Allergies:   Allergies  Allergen Reactions  . Chocolate Rash  . Other Rash    All carbonated drinks (soda)     Past Medical History:  Diagnosis Date  . Asthma   . Chronic bronchitis ([facility])   . Deviated septum   . Hypertension   . Nasal turbinate hypertrophy     Pt orientation to unit, room and routine. Information packet given to patient/family and safety video watched.  Admission INP armband ID verified with patient/family, and in place. SR up x 2, fall risk assessment complete with Patient and family verbalizing understanding of risks associated with falls. Pt verbalizes an understanding of how to use the call bell and to call for help before getting out of bed.  Skin, clean-dry- intact without evidence of bruising, or skin tears.   No evidence of skin break down noted on exam.   Will cont to monitor and assist as needed.  , Phineas Douglas 09/08/2019 5:58 PM

## 2019-09-08 NOTE — Progress Notes (Signed)
   Vital Signs MEWS/VS Documentation      09/08/2019 1500 09/08/2019 1605 09/08/2019 1611 09/08/2019 1740   MEWS Score:  3  4  5  5    MEWS Score Color:  Yellow  Red  Red  Red   Resp:  (!) 32  --  (!) 37  (!) 21   Pulse:  (!) 103  --  (!) 117  (!) 123   BP:  (!) 143/93  --  (!) 157/90  (!) 147/92   Temp:  --  --  100.3 F (37.9 C)  (!) 102.3 F (39.1 C)   O2 Device:  --  --  Room Air  Nasal Cannula   O2 Flow Rate (L/min):  --  --  --  2 L/min   Level of Consciousness:  --  --  Alert  Alert       Patient MEWS score is 4  Due increased respirations,  and HR 103 , pt is stable no acute distress, shortness of breath or discomfort. Pt MD. page regarding MEWS score. Will continue to monitor Q2 vital as order. Pt at this time is tolerating proning well.    Isa Kohlenberg A Rivera Montejano 09/08/2019,6:07 PM

## 2019-09-08 NOTE — ED Triage Notes (Signed)
Pt arrives to ED with c/c of sob- pt was diagnosed with covid-19 Monday.

## 2019-09-08 NOTE — Telephone Encounter (Addendum)
Per Epic encounter, patient was admitted to the hospital for positive COVID test and respiratory distress. Notes from ED admit and stay are in Epic for review.  Per Thermon Leyland, FNP, she will continue to follow patients progress and will inform Dr. Beaulah Dinning of updates.  Okay to close this encounter regarding calling patient to get updates on status from OV on 09/06/2019.

## 2019-09-08 NOTE — Telephone Encounter (Signed)
Pt has been admitted to hospital with a positive covid

## 2019-09-08 NOTE — ED Provider Notes (Signed)
[facility] EMERGENCY DEPARTMENT Provider Note   CSN: [id number] Arrival date & time: 09/08/19  8270     History Chief Complaint  Patient presents with  . Shortness of Breath    Isaiah Sellers is a 59 y.o. male with PMH significant for chronic bronchitis, HTN, asthma, and positive COVID-19 testing obtained 09/06/2019 who presents to the ED with worsening shortness of breath symptoms.  Patient was recently evaluated by Dr. Beaulah Dinning from the [facility] of [place] and was prescribed a prednisone taper in addition to his regular asthma medications for his cough and SOB symptoms.  Patient reports that symptoms began 09/02/2019 and progressively getting worse.  He shortness of breath symptoms are worse at night lying down and he has been unable to sleep.  Despite his chronic history of pulmonary disease, he has never experienced shortness of breath like this before.  He endorses chest discomfort, however only with coughing.  No pleuritic cough or pleuritic chest pain.  Patient endorses chills this a.m. as well as ongoing diminished appetite, but denies any fevers, headache or dizziness, abdominal pain, nausea or vomiting, changes in bowel habits, or urinary symptoms.  HPI     Past Medical History:  Diagnosis Date  . Asthma   . Chronic bronchitis (HCC)   . Deviated septum   . Hypertension   . Nasal turbinate hypertrophy     Patient Active Problem List   Diagnosis Date Noted  . COVID-19 virus infection 09/08/2019  . At increased risk of exposure to COVID-19 virus 09/06/2019  . Cough, persistent 11/26/2018  . Gastroesophageal reflux disease 11/26/2018  . Asthma with acute exacerbation 12/03/2017  . Mild persistent asthma 11/18/2016  . Acute maxillary sinusitis 05/29/2016  . Other allergic rhinitis 01/03/2016  . Essential hypertension 01/03/2016    Past Surgical History:  Procedure Laterality Date  . ENDOSCOPIC CONCHA BULLOSA RESECTION Right 08/18/2018   Procedure: ENDOSCOPIC RIGHT CONCHA BULLOSA RESECTION;  Surgeon: Newman Pies, MD;  Location: [facility];  Service: ENT;  Laterality: Right;  . ETHMOIDECTOMY Bilateral 08/18/2018   Procedure: TOTAL ETHMOIDECTOMY AND FRONTAL RECESS EXPLORATION;  Surgeon: Newman Pies, MD;  Location: [facility];  Service: ENT;  Laterality: Bilateral;  . MAXILLARY ANTROSTOMY Bilateral 08/18/2018   Procedure: ENDOSCOPIC MAXILLARY ANTROSTOMY WITH TISSUE REMOVAL;  Surgeon: Newman Pies, MD;  Location: [facility];  Service: ENT;  Laterality: Bilateral;  . NASAL SEPTOPLASTY W/ TURBINOPLASTY Bilateral 08/18/2018   Procedure: NASAL SEPTOPLASTY WITH BILATERAL TURBINATE REDUCTION;  Surgeon: Newman Pies, MD;  Location: [facility];  Service: ENT;  Laterality: Bilateral;  . SINUS ENDO WITH FUSION Bilateral 08/18/2018   Procedure: SINUS ENDOSCOPY WITH FUSION NAVIGATION;  Surgeon: Newman Pies, MD;  Location:  SURGERY CENTER;  Service: ENT;  Laterality: Bilateral;       Family History  Problem Relation Age of Onset  . Angioedema Neg Hx   . Allergic rhinitis Neg Hx   . Asthma Neg Hx   . Immunodeficiency Neg Hx   . Eczema Neg Hx   . Urticaria Neg Hx     Social History   Tobacco Use  . Smoking status: Never Smoker  . Smokeless tobacco: Never Used  Substance Use Topics  . Alcohol use: Yes    Alcohol/week: 2.0 standard drinks    Types: 2 Cans of beer per week    Comment: socially  . Drug use: No    Home Medications Prior to Admission medications  Medication Sig Start Date End Date Taking? Authorizing Provider  albuterol (VENTOLIN HFA) 108 (90 Base) MCG/ACT inhaler INHALE 2 PUFFS INTO THE LUNGS EVERY 4 HOURS AS NEEDED FOR WHEEZING OR SHORTNESS OF BREATH 09/01/19  Yes Ambs, Norvel Richards, FNP  amLODipine (NORVASC) 10 MG tablet Take 10 mg by mouth daily.  11/18/15  Yes [provider]  azelastine (ASTELIN) 0.1 % nasal spray 1-2 sprays per nostril 2 times daily as needed  09/06/19  Yes Ambs, Norvel Richards, FNP  budesonide-formoterol (SYMBICORT) 160-4.5 MCG/ACT inhaler Inhale 2 puffs into the lungs 2 (two) times daily. Use with spacer.Rinse, gargle and spit out after use 09/06/19  Yes Ambs, Norvel Richards, FNP  cetirizine (ZYRTEC) 10 MG tablet Take 1 tablet (10 mg total) by mouth as needed. Patient taking differently: Take 10 mg by mouth daily as needed for allergies.  09/06/19  Yes Ambs, Norvel Richards, FNP  Cholecalciferol (VITAMIN D PO) Take 1,000 Units by mouth daily.    Yes [provider]  clindamycin (CLEOCIN T) 1 % lotion Apply 1 application topically as needed (for skin (acne)).  08/12/17  Yes [provider]  doxycycline (VIBRAMYCIN) 100 MG capsule Take 100 mg by mouth 2 (two) times daily as needed (skin irritation due to allergies).  02/11/18  Yes [provider]  esomeprazole (NEXIUM) 20 MG packet Take 20 mg by mouth daily before breakfast. 09/06/19  Yes Ambs, Norvel Richards, FNP  fluocinonide (LIDEX) 0.05 % external solution Apply 1 application topically daily as needed (for scalp irritation).  11/18/16  Yes [provider]  fluticasone (FLONASE) 50 MCG/ACT nasal spray Place 2 sprays into both nostrils daily. 10/05/18  Yes Ambs, Norvel Richards, FNP  fluticasone (FLOVENT HFA) 110 MCG/ACT inhaler Inhale 2 puffs into the lungs 2 (two) times daily. For asthma flares. Rinse, gargle and spit out after use 04/29/19  Yes Ambs, Norvel Richards, FNP  ibuprofen (ADVIL) 200 MG tablet Take 400 mg by mouth every 6 (six) hours as needed for moderate pain.   Yes [provider]  losartan (COZAAR) 50 MG tablet Take 50 mg by mouth daily.    Yes [provider]  montelukast (SINGULAIR) 10 MG tablet 1 tablet once at night for coughing or wheezing. 09/06/19  Yes Ambs, Norvel Richards, FNP  Multiple Vitamins-Minerals (DAILY MULTIVITAMIN PO) Take 1 tablet by mouth daily.    Yes [provider]  Spacer/Aero-Holding Rudean Curt Use as directed with inhaler 11/26/18  Yes Bobbitt, Heywood Iles, MD    Allergies    Chocolate and Other  Review of Systems   Review of Systems  All other systems reviewed and are negative.   Physical Exam Updated Vital Signs BP 118/69   Pulse 96   Temp (S) (!) 101.7 F (38.7 C) (Oral)   Resp (!) 30   Ht 6\' 1"  (1.854 m)   Wt 102.1 kg   SpO2 92%   BMI 29.69 kg/m   Physical Exam Vitals and nursing note reviewed. Exam conducted with a chaperone present.  Constitutional:      Appearance: Normal appearance.  HENT:     Head: Normocephalic and atraumatic.  Eyes:     General: No scleral icterus.    Conjunctiva/sclera: Conjunctivae normal.  Cardiovascular:     Rate and Rhythm: Regular rhythm. Tachycardia present.     Pulses: Normal pulses.     Heart sounds: Normal heart sounds.  Pulmonary:     Comments: Increased work of breathing.  No accessory muscle use.  Tachypneic in 30s at rest.  Mild wheezing auscultated, particularly on the right side. Musculoskeletal:     Cervical back: Normal range of motion and neck supple. No rigidity.     Right lower leg: No edema.     Left lower leg: No edema.  Skin:    General: Skin is dry.     Capillary Refill: Capillary refill takes less than 2 seconds.  Neurological:     Mental Status: He is alert and oriented to person, place, and time.     GCS: GCS eye subscore is 4. GCS verbal subscore is 5. GCS motor subscore is 6.  Psychiatric:        Mood and Affect: Mood normal.        Behavior: Behavior normal.        Thought Content: Thought content normal.       ED Results / Procedures / Treatments   Labs (all labs ordered are listed, but only abnormal results are displayed) Labs Reviewed  LACTIC ACID, PLASMA - Abnormal; Notable for the following components:      Result Value   Lactic Acid, Venous 2.8 (*)    All other components within normal limits  CBC WITH DIFFERENTIAL/PLATELET - Abnormal; Notable for the following components:   WBC 10.9 (*)    Neutro Abs 9.4 (*)    All other  components within normal limits  COMPREHENSIVE METABOLIC PANEL - Abnormal; Notable for the following components:   CO2 20 (*)    Glucose, Bld 132 (*)    Calcium 8.5 (*)    Albumin 3.1 (*)    AST 42 (*)    All other components within normal limits  D-DIMER, QUANTITATIVE (NOT AT [facility]) - Abnormal; Notable for the following components:   D-Dimer, Quant 1.21 (*)    All other components within normal limits  LACTATE DEHYDROGENASE - Abnormal; Notable for the following components:   LDH 323 (*)    All other components within normal limits  FIBRINOGEN - Abnormal; Notable for the following components:   Fibrinogen 592 (*)    All other components within normal limits  C-REACTIVE PROTEIN - Abnormal; Notable for the following components:   CRP 11.2 (*)    All other components within normal limits  CULTURE, BLOOD (ROUTINE X 2)  CULTURE, BLOOD (ROUTINE X 2)  PROCALCITONIN  FERRITIN  TRIGLYCERIDES  HIV ANTIBODY (ROUTINE TESTING W REFLEX)  LACTIC ACID, PLASMA  LACTIC ACID, PLASMA  ABO/RH    EKG None  Radiology DG Chest Port 1 View  Result Date: 09/08/2019 CLINICAL DATA:  Shortness of breath.  COVID-19 positive EXAM: PORTABLE CHEST 1 VIEW COMPARISON:  Dec 14, 2013. FINDINGS: Degree of inspiration is shallow. There is slight right base atelectasis. Lungs elsewhere clear. Heart size and pulmonary vascularity are normal. No adenopathy. No bone lesions. IMPRESSION: Shallow degree of inspiration. A degree of underlying restrictive disease cannot be excluded. Slight right base atelectasis. Lungs elsewhere clear. Cardiac silhouette normal. Electronically Signed   By: Bretta Bang III M.D.   On: 09/08/2019 10:12    Procedures Procedures (including critical care time)  Medications Ordered in ED Medications  amLODipine (NORVASC) tablet 10 mg (has no administration in time range)  losartan (COZAAR) tablet 50 mg (has no administration in time range)  loratadine (CLARITIN) tablet 10 mg (has no  administration in time range)  fluticasone (FLONASE) 50 MCG/ACT nasal spray 2 spray (has no administration in time range)  fluticasone (FLOVENT HFA) 110 MCG/ACT inhaler 2 puff (  has no administration in time range)  montelukast (SINGULAIR) tablet 10 mg (has no administration in time range)  enoxaparin (LOVENOX) injection 40 mg (has no administration in time range)  sodium chloride flush (NS) 0.9 % injection 3 mL (has no administration in time range)  remdesivir 200 mg in sodium chloride 0.9% 250 mL IVPB (has no administration in time range)    Followed by  remdesivir 100 mg in sodium chloride 0.9 % 100 mL IVPB (has no administration in time range)  ondansetron (ZOFRAN) tablet 4 mg (has no administration in time range)    Or  ondansetron (ZOFRAN) injection 4 mg (has no administration in time range)  acetaminophen (TYLENOL) tablet 650 mg (has no administration in time range)  albuterol (VENTOLIN HFA) 108 (90 Base) MCG/ACT inhaler 2 puff (has no administration in time range)  dexamethasone (DECADRON) tablet 6 mg (has no administration in time range)  guaiFENesin-dextromethorphan (ROBITUSSIN DM) 100-10 MG/5ML syrup 10 mL (has no administration in time range)  chlorpheniramine-HYDROcodone (TUSSIONEX) 10-8 MG/5ML suspension 5 mL (has no administration in time range)  ascorbic acid (VITAMIN C) tablet 500 mg (has no administration in time range)  zinc sulfate capsule 220 mg (has no administration in time range)  famotidine (PEPCID) tablet 20 mg (has no administration in time range)  acetaminophen (TYLENOL) tablet 650 mg (650 mg Oral Given 09/08/19 1026)  0.9 %  sodium chloride infusion ( Intravenous New Bag/Given 09/08/19 1214)    ED Course  I have reviewed the triage vital signs and the nursing notes.  Pertinent labs & imaging results that were available during my care of the patient were reviewed by me and considered in my medical decision making (see chart for details).  Clinical Course as of  Sep 07 1401  Wed Sep 07, 3245  3230 59 year old male recently diagnosed with Covid here with increased shortness of breath.  Febrile tachypneic tachycardic.  Not hypoxic.  Chest x-ray not showing any gross infiltrates.  Getting labs and antipyretic.  Possible admission.   [MB]  82 Spoke with Dr. Tamala Julian who will evaluate and admit patient for his increased work of breathing.    [GG]    Clinical Course User Index [GG] Corena Herter, PA-C [MB] Hayden Rasmussen, MD   MDM Rules/Calculators/A&P                      Patient has significant increased work of breathing and tachypnea in setting of COVID-19.  Will provide NS at 100 mL/h given his elevated lactic acid of 2.8, however will not provide boluses or aggressively hydrate given possibility of worsening respiratory status.  Tylenol 650 mg provided given patient febrile to 101.7 F.  Discussed case with Dr. Melina Copa and will consult with hospitalist for admission despite lack of hypoxia.  KOLTER REAVER was evaluated in Emergency Department on 09/08/2019 for the symptoms described in the history of present illness. He was evaluated in the context of the global COVID-19 pandemic, which necessitated consideration that the patient might be at risk for infection with the SARS-CoV-2 virus that causes COVID-19. Institutional protocols and algorithms that pertain to the evaluation of patients at risk for COVID-19 are in a state of rapid change based on information released by regulatory bodies including the CDC and federal and state organizations. These policies and algorithms were followed during the patient's care in the ED.  Final Clinical Impression(s) / ED Diagnoses Final diagnoses:  [id number]    Rx / DC Orders  ED Discharge Orders    None       Lorelee New, PA-C 09/08/19 1404    Terrilee Files, MD 09/08/19 [phone]

## 2019-09-08 NOTE — Telephone Encounter (Signed)
Called patient to get status update on how patient is doing.  Left message on patients voice mail to call clinic with update on how he is doing and to get his recent COVID test results to Dr. Beaulah Dinning when available.

## 2019-09-09 LAB — COMPREHENSIVE METABOLIC PANEL
ALT: 29 U/L (ref 0–44)
AST: 40 U/L (ref 15–41)
Albumin: 2.8 g/dL — ABNORMAL LOW (ref 3.5–5.0)
Alkaline Phosphatase: 66 U/L (ref 38–126)
Anion gap: 10 (ref 5–15)
BUN: 11 mg/dL (ref 6–20)
CO2: 23 mmol/L (ref 22–32)
Calcium: 8.5 mg/dL — ABNORMAL LOW (ref 8.9–10.3)
Chloride: 102 mmol/L (ref 98–111)
Creatinine, Ser: 0.9 mg/dL (ref 0.61–1.24)
GFR calc Af Amer: >60 mL/min (ref >60)
GFR calc non Af Amer: >60 mL/min (ref >60)
Glucose, Bld: 129 mg/dL — ABNORMAL HIGH (ref 70–99)
Potassium: 4 mmol/L (ref 3.5–5.1)
Sodium: 135 mmol/L (ref 135–145)
Total Bilirubin: 0.7 mg/dL (ref 0.3–1.2)
Total Protein: 6.5 g/dL (ref 6.5–8.1)

## 2019-09-09 LAB — CBC WITH DIFFERENTIAL/PLATELET
Abs Immature Granulocytes: 0.11 10*3/uL — ABNORMAL HIGH (ref 0.00–0.07)
Basophils Absolute: 0 10*3/uL (ref 0.0–0.1)
Basophils Relative: 0 %
Eosinophils Absolute: 0 10*3/uL (ref 0.0–0.5)
Eosinophils Relative: 0 %
HCT: 38 % — ABNORMAL LOW (ref 39.0–52.0)
Hemoglobin: 12.7 g/dL — ABNORMAL LOW (ref 13.0–17.0)
Immature Granulocytes: 1 %
Lymphocytes Relative: 9 %
Lymphs Abs: 1.2 10*3/uL (ref 0.7–4.0)
MCH: 30 pg (ref 26.0–34.0)
MCHC: 33.4 g/dL (ref 30.0–36.0)
MCV: 89.8 fL (ref 80.0–100.0)
Monocytes Absolute: 0.3 10*3/uL (ref 0.1–1.0)
Monocytes Relative: 2 %
Neutro Abs: 12.1 10*3/uL — ABNORMAL HIGH (ref 1.7–7.7)
Neutrophils Relative %: 88 %
Platelets: 164 10*3/uL (ref 150–400)
RBC: 4.23 MIL/uL (ref 4.22–5.81)
RDW: 13.9 % (ref 11.5–15.5)
WBC: 13.7 10*3/uL — ABNORMAL HIGH (ref 4.0–10.5)
nRBC: 0 % (ref 0.0–0.2)

## 2019-09-09 LAB — PHOSPHORUS: Phosphorus: 3.4 mg/dL (ref 2.5–4.6)

## 2019-09-09 LAB — GLUCOSE, CAPILLARY: Glucose-Capillary: 122 mg/dL — ABNORMAL HIGH (ref 70–99)

## 2019-09-09 LAB — MAGNESIUM: Magnesium: 1.8 mg/dL (ref 1.7–2.4)

## 2019-09-09 LAB — C-REACTIVE PROTEIN: CRP: 19.9 mg/dL — ABNORMAL HIGH (ref <1.0)

## 2019-09-09 LAB — D-DIMER, QUANTITATIVE: D-Dimer, Quant: 1.14 ug/mL-FEU — ABNORMAL HIGH (ref 0.00–0.50)

## 2019-09-09 LAB — FERRITIN: Ferritin: 282 ng/mL (ref 24–336)

## 2019-09-09 MED ORDER — DEXAMETHASONE SODIUM PHOSPHATE 10 MG/ML IJ SOLN
10.0000 mg | Freq: Every day | INTRAMUSCULAR | Status: DC
  Administered 2019-09-10 – 2019-09-12 (×3): 10 mg via INTRAVENOUS
  Filled 2019-09-09 (×3): qty 1

## 2019-09-09 MED ORDER — MOMETASONE FURO-FORMOTEROL FUM 200-5 MCG/ACT IN AERO
2.0000 | INHALATION_SPRAY | Freq: Two times a day (BID) | RESPIRATORY_TRACT | Status: DC
  Administered 2019-09-09 – 2019-09-12 (×7): 2 via RESPIRATORY_TRACT
  Filled 2019-09-09: qty 8.8

## 2019-09-09 MED ORDER — DEXAMETHASONE SODIUM PHOSPHATE 4 MG/ML IJ SOLN
4.0000 mg | Freq: Once | INTRAMUSCULAR | Status: AC
  Administered 2019-09-09: 09:00:00 4 mg via INTRAVENOUS
  Filled 2019-09-09: qty 1

## 2019-09-09 NOTE — Progress Notes (Signed)
PROGRESS NOTE                                                                                                                                                                                                             Patient Demographics:    Isaiah Sellers, is a 59 y.o. male, DOB - [birth year], VQQ:[id number]  Admit date - 09/08/2019   Admitting Physician Norval Morton, MD  Outpatient Primary MD for the patient is Kristopher Glee., MD  LOS - 1   Chief Complaint  Patient presents with  . Shortness of Breath       Brief Narrative    59 y.o. male with medical history significant of hypertension, asthma, chronic bronchitis, and reflux.  Patient presents with complaints of cough and progressively worsening shortness of breath over the last 6 days.  He was diagnosed with COVID-19 on 2/8.  He had been prescribed a prednisone taper forwhich he had taken 2 days worth of medication by his allergist Dr. Shaune Leeks.  He reports being unable to rest at night due to his symptoms.  Other associated symptoms include some mild back discomfort, chills, decreased appetite, and some chest discomfort when coughing.  Denies having any fever, headache, change in taste/smell, nausea, vomiting, or diarrhea symptoms.   ED Course: Upon admission into the emergency department patient was noted to be febrile up to 101.7 F, pulse 110-120, respiration 22-28, blood pressure stable, and O2 saturation maintained on room air.  Labs significant for WBC 10.9, AST 42, LDH 323, CRP 11.2, lactic acid 2.8, D-dimer 1.21, fibrinogen 592, and pro calcitonin 0.3.  Chest x-ray was limited due to hypoinflated.  He had been given Tylenol for fever symptoms and 1 L of normal saline IV fluids.  TRH called to admit.    Subjective:    Isaiah Sellers today reports dyspnea, cough, denies any chest pain, no nausea or vomiting.   Assessment  & Plan :    Principal Problem:   COVID-19 virus  infection Active Problems:   Essential hypertension   Mild persistent asthma   Acute respiratory distress   Sepsis ([place])   Acute respiratory distress secondary to COVID-19 . -Chest x-ray on presentation does not show any clear pneumonia, but I think atelectasis at the bases most likely early infiltrate, will repeat chest x-ray in 24 hours . -  Patient remains on 2 to 4 L nasal cannula oxygen requirement . -Continue to trend inflammatory markers, CRP significantly elevated at 19.9 . -Continue with IV steroids, have increased to 10 mg oral daily . -Continue with IV remdesivir . -I have discussed with patient at length, encouraged to use incentive spirometry, flutter valve, to get out of bed to chair and to prone once back in bed . -Empiric antibiotics of Rocephin and azithromycin   COVID-19 Labs  Recent Labs    09/08/19 1015 09/09/19 0416  DDIMER 1.21* 1.14*  FERRITIN 224 282  LDH 323*  --   CRP 11.2* 19.9*    Lab Results  Component Value Date   SARSCOV2NAA Detected (A) 09/06/2019    Sepsis: Secondary to above.  -Follow-up blood culture -Empiric antibiotics of Rocephin and azithromycin  Mild persistent asthma: - Patient without wheezing currently noted on physical exam at this time.   -Continue Flovent inhaler  Essential hypertension:  -Continue  Home medications include amlodipine 10 mg daily and losartan 50 mg daily.   Elevated AST:  -Secondary to Covid, monitor closely especially he is on remdesivir  GERD: Patient on Nexium at home. -Pepcid   Code Status : Full  Family Communication  : D/W patient, tried to call wife, but no answer.  Disposition Plan  : home  Barriers For Discharge : Remains on oxygen, IV steroids and IV remdesivir, still clinically not stable  Consults  :  None  Procedures  : None  DVT Prophylaxis  :  [place] lovenox  Lab Results  Component Value Date   PLT 164 09/09/2019    Antibiotics  :    Anti-infectives (From admission,  onward)   Start     Dose/Rate Route Frequency Ordered Stop   09/09/19 1000  remdesivir 100 mg in sodium chloride 0.9 % 100 mL IVPB     100 mg 200 mL/hr over 30 Minutes Intravenous Daily 09/08/19 1351 09/13/19 0959   09/08/19 1430  cefTRIAXone (ROCEPHIN) 2 g in sodium chloride 0.9 % 100 mL IVPB     2 g 200 mL/hr over 30 Minutes Intravenous Every 24 hours 09/08/19 1422 09/13/19 1429   09/08/19 1430  azithromycin (ZITHROMAX) 500 mg in sodium chloride 0.9 % 250 mL IVPB     500 mg 250 mL/hr over 60 Minutes Intravenous Every 24 hours 09/08/19 1422 09/13/19 1429   09/08/19 1345  remdesivir 200 mg in sodium chloride 0.9% 250 mL IVPB     200 mg 580 mL/hr over 30 Minutes Intravenous Once 09/08/19 1351 09/08/19 1519        Objective:   Vitals:   09/09/19 0607 09/09/19 0800 09/09/19 0900 09/09/19 1319  BP: 132/87 133/84  132/85  Pulse: (!) 107 97  93  Resp: (!) 28 18  19   Temp: 99.3 F (37.4 C) 98.4 F (36.9 C)  98.4 F (36.9 C)  TempSrc: Oral Oral  Oral  SpO2: 95% 90% (!) 89%   Weight:      Height:        Wt Readings from Last 3 Encounters:  09/08/19 102.1 kg  04/29/19 103.1 kg  08/26/18 103.5 kg     Intake/Output Summary (Last 24 hours) at 09/09/2019 1449 Last data filed at 09/09/2019 0700 Gross per 24 hour  Intake 830 ml  Output 850 ml  Net -20 ml     Physical Exam  Awake Alert, Oriented X 3, No new F.N deficits, Normal affect Symmetrical Chest wall movement, Good air movement bilaterally,  CTAB RRR,No Gallops,Rubs or new Murmurs, No Parasternal Heave +ve B.Sounds, Abd Soft, No tenderness, No rebound - guarding or rigidity. No Cyanosis, Clubbing or edema, No new Rash or bruise    Data Review:    CBC Recent Labs  Lab 09/08/19 1015 09/09/19 0416  WBC 10.9* 13.7*  HGB 13.4 12.7*  HCT 40.5 38.0*  PLT 170 164  MCV 90.2 89.8  MCH 29.8 30.0  MCHC 33.1 33.4  RDW 13.7 13.9  LYMPHSABS 1.1 1.2  MONOABS 0.3 0.3  EOSABS 0.0 0.0  BASOSABS 0.0 0.0    Chemistries    Recent Labs  Lab 09/08/19 1015 09/09/19 0416  NA 137 135  K 4.4 4.0  CL 105 102  CO2 20* 23  GLUCOSE 132* 129*  BUN 13 11  CREATININE 1.09 0.90  CALCIUM 8.5* 8.5*  MG  --  1.8  AST 42* 40  ALT 29 29  ALKPHOS 65 66  BILITOT 0.9 0.7   ------------------------------------------------------------------------------------------------------------------ Recent Labs    09/08/19 1015  TRIG 106    No results found for: HGBA1C ------------------------------------------------------------------------------------------------------------------ No results for input(s): TSH, T4TOTAL, T3FREE, THYROIDAB in the last 72 hours.  Invalid input(s): FREET3 ------------------------------------------------------------------------------------------------------------------ Recent Labs    09/08/19 1015 09/09/19 0416  FERRITIN 224 282    Coagulation profile No results for input(s): INR, PROTIME in the last 168 hours.  Recent Labs    09/08/19 1015 09/09/19 0416  DDIMER 1.21* 1.14*    Cardiac Enzymes No results for input(s): CKMB, TROPONINI, MYOGLOBIN in the last 168 hours.  Invalid input(s): CK ------------------------------------------------------------------------------------------------------------------ No results found for: BNP  Inpatient Medications  Scheduled Meds: . albuterol  2 puff Inhalation Q6H  . amLODipine  10 mg Oral Daily  . vitamin C  500 mg Oral Daily  . [START ON 09/10/2019] dexamethasone (DECADRON) injection  10 mg Intravenous Daily  . enoxaparin (LOVENOX) injection  40 mg Subcutaneous Q24H  . famotidine  20 mg Oral BID  . fluticasone  2 spray Each Nare Daily  . loratadine  10 mg Oral Daily  . losartan  50 mg Oral Daily  . mometasone-formoterol  2 puff Inhalation BID  . montelukast  10 mg Oral QHS  . sodium chloride flush  3 mL Intravenous Q12H  . zinc sulfate  220 mg Oral Daily   Continuous Infusions: . azithromycin 500 mg (09/09/19 1438)  . cefTRIAXone  (ROCEPHIN)  IV 2 g (09/09/19 1351)  . remdesivir 100 mg in NS 100 mL 100 mg (09/09/19 1045)   PRN Meds:.acetaminophen, chlorpheniramine-HYDROcodone, guaiFENesin-dextromethorphan, ondansetron **OR** ondansetron (ZOFRAN) IV  Micro Results Recent Results (from the past 240 hour(s))  Novel Coronavirus, NAA ([facility])     Status: Abnormal   Collection Time: 09/06/19  2:31 PM   Specimen: Nasopharyngeal(NP) swabs in vial transport medium   NASOPHARYNGE  TESTING  Result Value Ref Range Status   SARS-CoV-2, NAA Detected (A) Not Detected Final    Comment: This nucleic acid amplification test was developed and its performance characteristics determined by [employer]. Nucleic acid amplification tests include RT-PCR and TMA. This test has not been FDA cleared or approved. This test has been authorized by FDA under an Emergency Use Authorization (EUA). This test is only authorized for the duration of time the declaration that circumstances exist justifying the authorization of the emergency use of in vitro diagnostic tests for detection of SARS-CoV-2 virus and/or diagnosis of COVID-19 infection under section 564(b)(1) of the Act, 21 U.S.C. 681EXN-1(Z) (1), unless the authorization is terminated  or revoked sooner. When diagnostic testing is negative, the possibility of a false negative result should be considered in the context of a patient's recent exposures and the presence of clinical signs and symptoms consistent with COVID-19. An individual without symptoms of COVID-19 and who is not shedding SARS-CoV-2 virus wo uld expect to have a negative (not detected) result in this assay.   Blood Culture (routine x 2)     Status: None (Preliminary result)   Collection Time: 09/08/19 10:45 AM   Specimen: BLOOD RIGHT FOREARM  Result Value Ref Range Status   Specimen Description BLOOD RIGHT FOREARM  Final   Special Requests   Final    BOTTLES DRAWN AEROBIC AND ANAEROBIC Blood Culture adequate  volume   Culture   Final    NO GROWTH 1 DAY Performed at [facility], [address], [place], [place] [address]    Report Status PENDING  Incomplete  Blood Culture (routine x 2)     Status: None (Preliminary result)   Collection Time: 09/08/19 10:55 AM   Specimen: BLOOD  Result Value Ref Range Status   Specimen Description BLOOD RIGHT ANTECUBITAL  Final   Special Requests   Final    BOTTLES DRAWN AEROBIC AND ANAEROBIC Blood Culture adequate volume   Culture   Final    NO GROWTH 1 DAY Performed at [facility], [address], [place], [place] [address]    Report Status PENDING  Incomplete    Radiology Reports DG Chest Port 1 View  Result Date: 09/08/2019 CLINICAL DATA:  Shortness of breath.  COVID-19 positive EXAM: PORTABLE CHEST 1 VIEW COMPARISON:  Dec 14, 2013. FINDINGS: Degree of inspiration is shallow. There is slight right base atelectasis. Lungs elsewhere clear. Heart size and pulmonary vascularity are normal. No adenopathy. No bone lesions. IMPRESSION: Shallow degree of inspiration. A degree of underlying restrictive disease cannot be excluded. Slight right base atelectasis. Lungs elsewhere clear. Cardiac silhouette normal. Electronically Signed   By: Bretta Bang III M.D.   On: 09/08/2019 10:12      Huey Bienenstock M.D on 09/09/2019 at 2:49 PM  Between 7am to 7pm - Pager - [phone]  After 7pm go to [email or web address] - password [facility]  [facility] -  Office  [phone]

## 2019-09-09 NOTE — Telephone Encounter (Signed)
Thanks.  We will monitor his progress

## 2019-09-10 ENCOUNTER — Inpatient Hospital Stay (HOSPITAL_COMMUNITY): Payer: 59

## 2019-09-10 LAB — CBC WITH DIFFERENTIAL/PLATELET
Abs Immature Granulocytes: 0.06 10*3/uL (ref 0.00–0.07)
Basophils Absolute: 0 10*3/uL (ref 0.0–0.1)
Basophils Relative: 0 %
Eosinophils Absolute: 0 10*3/uL (ref 0.0–0.5)
Eosinophils Relative: 0 %
HCT: 40.4 % (ref 39.0–52.0)
Hemoglobin: 13.5 g/dL (ref 13.0–17.0)
Immature Granulocytes: 1 %
Lymphocytes Relative: 11 %
Lymphs Abs: 1.1 10*3/uL (ref 0.7–4.0)
MCH: 30.1 pg (ref 26.0–34.0)
MCHC: 33.4 g/dL (ref 30.0–36.0)
MCV: 90 fL (ref 80.0–100.0)
Monocytes Absolute: 0.4 10*3/uL (ref 0.1–1.0)
Monocytes Relative: 4 %
Neutro Abs: 8.3 10*3/uL — ABNORMAL HIGH (ref 1.7–7.7)
Neutrophils Relative %: 84 %
Platelets: 195 10*3/uL (ref 150–400)
RBC: 4.49 MIL/uL (ref 4.22–5.81)
RDW: 13.5 % (ref 11.5–15.5)
WBC: 9.9 10*3/uL (ref 4.0–10.5)
nRBC: 0 % (ref 0.0–0.2)

## 2019-09-10 LAB — COMPREHENSIVE METABOLIC PANEL
ALT: 27 U/L (ref 0–44)
AST: 36 U/L (ref 15–41)
Albumin: 2.7 g/dL — ABNORMAL LOW (ref 3.5–5.0)
Alkaline Phosphatase: 68 U/L (ref 38–126)
Anion gap: 9 (ref 5–15)
BUN: 20 mg/dL (ref 6–20)
CO2: 25 mmol/L (ref 22–32)
Calcium: 9 mg/dL (ref 8.9–10.3)
Chloride: 104 mmol/L (ref 98–111)
Creatinine, Ser: 1 mg/dL (ref 0.61–1.24)
GFR calc Af Amer: >60 mL/min (ref >60)
GFR calc non Af Amer: >60 mL/min (ref >60)
Glucose, Bld: 127 mg/dL — ABNORMAL HIGH (ref 70–99)
Potassium: 4.5 mmol/L (ref 3.5–5.1)
Sodium: 138 mmol/L (ref 135–145)
Total Bilirubin: 0.7 mg/dL (ref 0.3–1.2)
Total Protein: 6.9 g/dL (ref 6.5–8.1)

## 2019-09-10 LAB — D-DIMER, QUANTITATIVE: D-Dimer, Quant: 1.13 ug/mL-FEU — ABNORMAL HIGH (ref 0.00–0.50)

## 2019-09-10 LAB — C-REACTIVE PROTEIN: CRP: 18.1 mg/dL — ABNORMAL HIGH (ref <1.0)

## 2019-09-10 NOTE — Progress Notes (Signed)
   Vital Signs MEWS/VS Documentation      09/10/2019 0926 09/10/2019 1332 09/10/2019 1937 09/10/2019 1940   MEWS Score:  --  0  0  2   MEWS Score Color:  --  Green  Green  Yellow   Resp:  --  --  --  (!) 27   Pulse:  --  84  --  99   BP:  --  122/80  130/76  137/86   Temp:  --  98.3 F (36.8 C)  98.6 F (37 C)  98.5 F (36.9 C)   O2 Device:  Room Air  Room Air  --  Room Air   Level of Consciousness:  --  --  --  Alert           Laverta Baltimore 09/10/2019,7:45 PM

## 2019-09-10 NOTE — Progress Notes (Signed)
Pt ambulated 250+ ft in unit. Pt O2 sats dipped and maintained 87-88% on RA. 1-2L [place] placed on pt w/ O2 sats rising to 90-92%. Pt stated he 'feels better' w/ O2 on whilst ambulating. Pt returned to room, in chair on RA w/ O2 sats at 92-93%.

## 2019-09-10 NOTE — Progress Notes (Signed)
PROGRESS NOTE                                                                                                                                                                                                             Patient Demographics:    Isaiah Sellers, is a 59 y.o. male, DOB - [birth year], WVP:[id number]  Admit date - 09/08/2019   Admitting Physician Norval Morton, MD  Outpatient Primary MD for the patient is Kristopher Glee., MD  LOS - 2   Chief Complaint  Patient presents with  . Shortness of Breath       Brief Narrative    59 y.o. male with medical history significant of hypertension, asthma, chronic bronchitis, and reflux.  Patient presents with complaints of cough and progressively worsening shortness of breath over the last 6 days.  He was diagnosed with COVID-19 on 2/8.  He had been prescribed a prednisone taper forwhich he had taken 2 days worth of medication by his allergist Dr. Shaune Leeks.  He reports being unable to rest at night due to his symptoms.  Other associated symptoms include some mild back discomfort, chills, decreased appetite, and some chest discomfort when coughing.  Denies having any fever, headache, change in taste/smell, nausea, vomiting, or diarrhea symptoms.   ED Course: Upon admission into the emergency department patient was noted to be febrile up to 101.7 F, pulse 110-120, respiration 22-28, blood pressure stable, and O2 saturation maintained on room air.  Labs significant for WBC 10.9, AST 42, LDH 323, CRP 11.2, lactic acid 2.8, D-dimer 1.21, fibrinogen 592, and pro calcitonin 0.3.  Chest x-ray was limited due to hypoinflated.  He had been given Tylenol for fever symptoms and 1 L of normal saline IV fluids.  TRH called to admit.    Subjective:    Isaiah Sellers today reports dyspnea, cough, denies any chest pain, no nausea or vomiting.   Assessment  & Plan :    Principal Problem:   COVID-19 virus  infection Active Problems:   Essential hypertension   Mild persistent asthma   Acute respiratory distress   Sepsis ([place])   Acute respiratory distress secondary to COVID-19 . -Chest x-ray this morning showing multifocal opacity significant for COVID-19 pneumonia . -Patient 4 L nasal cannula presentation, currently on room air at rest, requiring 1 to 2 L  with activity . -Continue to trend inflammatory markers, CRP significantly elevated at 19.9 . -Continue with IV steroids, have increased to 10 mg oral daily . -Continue with IV remdesivir day # 3/5. -I have discussed with patient at length, encouraged to use incentive spirometry, flutter valve, to get out of bed to chair and to prone once back in bed . -Empiric antibiotics of Rocephin and azithromycin   COVID-19 Labs  Recent Labs    09/08/19 1015 09/09/19 0416 09/10/19 0315  DDIMER 1.21* 1.14* 1.13*  FERRITIN 224 282  --   LDH 323*  --   --   CRP 11.2* 19.9* 18.1*    Lab Results  Component Value Date   SARSCOV2NAA Detected (A) 09/06/2019    Sepsis: Secondary to above.  -Follow-up blood culture -Empiric antibiotics of Rocephin and azithromycin  Mild persistent asthma: - Patient without wheezing currently noted on physical exam at this time.   -Continue Flovent inhaler  Essential hypertension:  -Continue  Home medications include amlodipine 10 mg daily and losartan 50 mg daily.   Elevated AST:  -Secondary to Covid, monitor closely especially he is on remdesivir  GERD: Patient on Nexium at home. -Pepcid   Code Status : Full  Family Communication  : D/W wife via phone  Disposition Plan  : home  Barriers For Discharge : Remains on  IV steroids and IV remdesivir.  Consults  :  None  Procedures  : None  DVT Prophylaxis  :  [place] lovenox  Lab Results  Component Value Date   PLT 195 09/10/2019    Antibiotics  :    Anti-infectives (From admission, onward)   Start     Dose/Rate Route Frequency Ordered  Stop   09/09/19 1000  remdesivir 100 mg in sodium chloride 0.9 % 100 mL IVPB     100 mg 200 mL/hr over 30 Minutes Intravenous Daily 09/08/19 1351 09/13/19 0959   09/08/19 1430  cefTRIAXone (ROCEPHIN) 2 g in sodium chloride 0.9 % 100 mL IVPB     2 g 200 mL/hr over 30 Minutes Intravenous Every 24 hours 09/08/19 1422 09/13/19 1429   09/08/19 1430  azithromycin (ZITHROMAX) 500 mg in sodium chloride 0.9 % 250 mL IVPB     500 mg 250 mL/hr over 60 Minutes Intravenous Every 24 hours 09/08/19 1422 09/13/19 1429   09/08/19 1345  remdesivir 200 mg in sodium chloride 0.9% 250 mL IVPB     200 mg 580 mL/hr over 30 Minutes Intravenous Once 09/08/19 1351 09/08/19 1519        Objective:   Vitals:   09/09/19 1921 09/09/19 2213 09/10/19 0926 09/10/19 1332  BP:  129/78  122/80  Pulse:  96  84  Resp:  18    Temp: 97.9 F (36.6 C) 98.5 F (36.9 C)  98.3 F (36.8 C)  TempSrc:  Oral  Oral  SpO2:  98% 95%   Weight:      Height:        Wt Readings from Last 3 Encounters:  09/08/19 102.1 kg  04/29/19 103.1 kg  08/26/18 103.5 kg     Intake/Output Summary (Last 24 hours) at 09/10/2019 1449 Last data filed at 09/10/2019 0859 Gross per 24 hour  Intake 948.49 ml  Output 790 ml  Net 158.49 ml     Physical Exam  Awake Alert, Oriented X 3, No new F.N deficits, Normal affect Symmetrical Chest wall movement, Good air movement bilaterally, CTAB RRR,No Gallops,Rubs or new Murmurs, No Parasternal Heave +  ve B.Sounds, Abd Soft, No tenderness, No rebound - guarding or rigidity. No Cyanosis, Clubbing or edema, No new Rash or bruise        Data Review:    CBC Recent Labs  Lab 09/08/19 1015 09/09/19 0416 09/10/19 0315  WBC 10.9* 13.7* 9.9  HGB 13.4 12.7* 13.5  HCT 40.5 38.0* 40.4  PLT 170 164 195  MCV 90.2 89.8 90.0  MCH 29.8 30.0 30.1  MCHC 33.1 33.4 33.4  RDW 13.7 13.9 13.5  LYMPHSABS 1.1 1.2 1.1  MONOABS 0.3 0.3 0.4  EOSABS 0.0 0.0 0.0  BASOSABS 0.0 0.0 0.0    Chemistries    Recent Labs  Lab 09/08/19 1015 09/09/19 0416 09/10/19 0315  NA 137 135 138  K 4.4 4.0 4.5  CL 105 102 104  CO2 20* 23 25  GLUCOSE 132* 129* 127*  BUN 13 11 20   CREATININE 1.09 0.90 1.00  CALCIUM 8.5* 8.5* 9.0  MG  --  1.8  --   AST 42* 40 36  ALT 29 29 27   ALKPHOS 65 66 68  BILITOT 0.9 0.7 0.7   ------------------------------------------------------------------------------------------------------------------ Recent Labs    09/08/19 1015  TRIG 106    No results found for: HGBA1C ------------------------------------------------------------------------------------------------------------------ No results for input(s): TSH, T4TOTAL, T3FREE, THYROIDAB in the last 72 hours.  Invalid input(s): FREET3 ------------------------------------------------------------------------------------------------------------------ Recent Labs    09/08/19 1015 09/09/19 0416  FERRITIN 224 282    Coagulation profile No results for input(s): INR, PROTIME in the last 168 hours.  Recent Labs    09/09/19 0416 09/10/19 0315  DDIMER 1.14* 1.13*    Cardiac Enzymes No results for input(s): CKMB, TROPONINI, MYOGLOBIN in the last 168 hours.  Invalid input(s): CK ------------------------------------------------------------------------------------------------------------------ No results found for: BNP  Inpatient Medications  Scheduled Meds: . albuterol  2 puff Inhalation Q6H  . amLODipine  10 mg Oral Daily  . vitamin C  500 mg Oral Daily  . dexamethasone (DECADRON) injection  10 mg Intravenous Daily  . enoxaparin (LOVENOX) injection  40 mg Subcutaneous Q24H  . famotidine  20 mg Oral BID  . fluticasone  2 spray Each Nare Daily  . loratadine  10 mg Oral Daily  . losartan  50 mg Oral Daily  . mometasone-formoterol  2 puff Inhalation BID  . montelukast  10 mg Oral QHS  . sodium chloride flush  3 mL Intravenous Q12H  . zinc sulfate  220 mg Oral Daily   Continuous Infusions: .  azithromycin 500 mg (09/09/19 1438)  . cefTRIAXone (ROCEPHIN)  IV 2 g (09/10/19 1404)  . remdesivir 100 mg in NS 100 mL 100 mg (09/10/19 0925)   PRN Meds:.acetaminophen, chlorpheniramine-HYDROcodone, guaiFENesin-dextromethorphan, ondansetron **OR** ondansetron (ZOFRAN) IV  Micro Results Recent Results (from the past 240 hour(s))  Novel Coronavirus, NAA ([facility])     Status: Abnormal   Collection Time: 09/06/19  2:31 PM   Specimen: Nasopharyngeal(NP) swabs in vial transport medium   NASOPHARYNGE  TESTING  Result Value Ref Range Status   SARS-CoV-2, NAA Detected (A) Not Detected Final    Comment: This nucleic acid amplification test was developed and its performance characteristics determined by 11/08/19. Nucleic acid amplification tests include RT-PCR and TMA. This test has not been FDA cleared or approved. This test has been authorized by FDA under an Emergency Use Authorization (EUA). This test is only authorized for the duration of time the declaration that circumstances exist justifying the authorization of the emergency use of in vitro diagnostic tests for detection  of SARS-CoV-2 virus and/or diagnosis of COVID-19 infection under section 564(b)(1) of the Act, 21 U.S.C. 622QJF-3(L) (1), unless the authorization is terminated or revoked sooner. When diagnostic testing is negative, the possibility of a false negative result should be considered in the context of a patient's recent exposures and the presence of clinical signs and symptoms consistent with COVID-19. An individual without symptoms of COVID-19 and who is not shedding SARS-CoV-2 virus wo uld expect to have a negative (not detected) result in this assay.   Blood Culture (routine x 2)     Status: None (Preliminary result)   Collection Time: 09/08/19 10:45 AM   Specimen: BLOOD RIGHT FOREARM  Result Value Ref Range Status   Specimen Description BLOOD RIGHT FOREARM  Final   Special Requests   Final    BOTTLES  DRAWN AEROBIC AND ANAEROBIC Blood Culture adequate volume   Culture   Final    NO GROWTH 2 DAYS Performed at [facility], [address] [place], [place] [address]    Report Status PENDING  Incomplete  Blood Culture (routine x 2)     Status: None (Preliminary result)   Collection Time: 09/08/19 10:55 AM   Specimen: BLOOD  Result Value Ref Range Status   Specimen Description BLOOD RIGHT ANTECUBITAL  Final   Special Requests   Final    BOTTLES DRAWN AEROBIC AND ANAEROBIC Blood Culture adequate volume   Culture   Final    NO GROWTH 2 DAYS Performed at [facility], [address] [place], [place] [address]    Report Status PENDING  Incomplete    Radiology Reports DG Chest Port 1 View  Result Date: 09/10/2019 CLINICAL DATA:  Cough and COVID-19 positivity EXAM: PORTABLE CHEST 1 VIEW COMPARISON:  09/08/2019 FINDINGS: Cardiac shadow is stable. Patchy opacities are noted slightly increased when compared with the prior exam consistent with the given clinical history of COVID-19 positivity. No sizable effusion is noted. No bony abnormality is seen. IMPRESSION: Increased parenchymal opacity particularly on the right consistent with the given clinical history. Electronically Signed   By: Alcide Clever M.D.   On: 09/10/2019 08:58   DG Chest Port 1 View  Result Date: 09/08/2019 CLINICAL DATA:  Shortness of breath.  COVID-19 positive EXAM: PORTABLE CHEST 1 VIEW COMPARISON:  Dec 14, 2013. FINDINGS: Degree of inspiration is shallow. There is slight right base atelectasis. Lungs elsewhere clear. Heart size and pulmonary vascularity are normal. No adenopathy. No bone lesions. IMPRESSION: Shallow degree of inspiration. A degree of underlying restrictive disease cannot be excluded. Slight right base atelectasis. Lungs elsewhere clear. Cardiac silhouette normal. Electronically Signed   By: Bretta Bang III M.D.   On: 09/08/2019 10:12      Huey Bienenstock M.D on 09/10/2019 at 2:49  PM  Between 7am to 7pm - Pager - [phone]  After 7pm go to [email or web address] - password [facility]  [facility] -  Office  [phone]

## 2019-09-10 NOTE — Progress Notes (Signed)
Due to the nature of Isaiah Sellers's health the chaplain responded to a request for prayer through a phone call. The chaplain prayed with Isaiah Sellers over the phone. No follow-up is necessary at this time.  Lavone Neri Chaplain Resident For questions concerning this note please contact me by pager [phone]

## 2019-09-11 LAB — COMPREHENSIVE METABOLIC PANEL
ALT: 27 U/L (ref 0–44)
AST: 33 U/L (ref 15–41)
Albumin: 2.7 g/dL — ABNORMAL LOW (ref 3.5–5.0)
Alkaline Phosphatase: 59 U/L (ref 38–126)
Anion gap: 11 (ref 5–15)
BUN: 18 mg/dL (ref 6–20)
CO2: 22 mmol/L (ref 22–32)
Calcium: 8.7 mg/dL — ABNORMAL LOW (ref 8.9–10.3)
Chloride: 106 mmol/L (ref 98–111)
Creatinine, Ser: 0.83 mg/dL (ref 0.61–1.24)
GFR calc Af Amer: >60 mL/min (ref >60)
GFR calc non Af Amer: >60 mL/min (ref >60)
Glucose, Bld: 109 mg/dL — ABNORMAL HIGH (ref 70–99)
Potassium: 3.8 mmol/L (ref 3.5–5.1)
Sodium: 139 mmol/L (ref 135–145)
Total Bilirubin: 0.7 mg/dL (ref 0.3–1.2)
Total Protein: 6.2 g/dL — ABNORMAL LOW (ref 6.5–8.1)

## 2019-09-11 LAB — GLUCOSE, CAPILLARY: Glucose-Capillary: 133 mg/dL — ABNORMAL HIGH (ref 70–99)

## 2019-09-11 LAB — CBC WITH DIFFERENTIAL/PLATELET
Abs Immature Granulocytes: 0.1 10*3/uL — ABNORMAL HIGH (ref 0.00–0.07)
Basophils Absolute: 0 10*3/uL (ref 0.0–0.1)
Basophils Relative: 0 %
Eosinophils Absolute: 0 10*3/uL (ref 0.0–0.5)
Eosinophils Relative: 0 %
HCT: 38.5 % — ABNORMAL LOW (ref 39.0–52.0)
Hemoglobin: 13.1 g/dL (ref 13.0–17.0)
Immature Granulocytes: 1 %
Lymphocytes Relative: 11 %
Lymphs Abs: 1.3 10*3/uL (ref 0.7–4.0)
MCH: 29.9 pg (ref 26.0–34.0)
MCHC: 34 g/dL (ref 30.0–36.0)
MCV: 87.9 fL (ref 80.0–100.0)
Monocytes Absolute: 0.6 10*3/uL (ref 0.1–1.0)
Monocytes Relative: 5 %
Neutro Abs: 10.2 10*3/uL — ABNORMAL HIGH (ref 1.7–7.7)
Neutrophils Relative %: 83 %
Platelets: 262 10*3/uL (ref 150–400)
RBC: 4.38 MIL/uL (ref 4.22–5.81)
RDW: 13.4 % (ref 11.5–15.5)
WBC: 12.2 10*3/uL — ABNORMAL HIGH (ref 4.0–10.5)
nRBC: 0 % (ref 0.0–0.2)

## 2019-09-11 LAB — C-REACTIVE PROTEIN: CRP: 6.7 mg/dL — ABNORMAL HIGH (ref <1.0)

## 2019-09-11 LAB — MRSA PCR SCREENING: MRSA by PCR: NEGATIVE

## 2019-09-11 LAB — D-DIMER, QUANTITATIVE: D-Dimer, Quant: 0.82 ug/mL-FEU — ABNORMAL HIGH (ref 0.00–0.50)

## 2019-09-11 NOTE — Progress Notes (Signed)
PROGRESS NOTE                                                                                                                                                                                                             Patient Demographics:    Isaiah Sellers, is a 59 y.o. male, DOB - January 17, 1961, PPI:[id number]  Admit date - 09/08/2019   Admitting Physician Norval Morton, MD  Outpatient Primary MD for the patient is Kristopher Glee., MD  LOS - 3   Chief Complaint  Patient presents with  . Shortness of Breath       Brief Narrative    59 y.o. male with medical history significant of hypertension, asthma, chronic bronchitis, and reflux.  Patient presents with complaints of cough and progressively worsening shortness of breath .  He was diagnosed with COVID-19 on 2/8.  He had been prescribed a prednisone taper forwhich he had taken 2 days worth of medication by his allergist Dr. Shaune Leeks.  Upon admission into the emergency department patient was noted to be febrile up to 101.7 F, pulse 110-120, respiration 22-28, blood pressure stable, and O2 saturation maintained on room air.  But later he did require 4 L nasal cannula upon admission to the floor.  Patient admitted for further management.   Subjective:    Isaiah Sellers today reports dyspnea, cough, denies any chest pain, no nausea or vomiting.   Assessment  & Plan :    Principal Problem:   COVID-19 virus infection Active Problems:   Essential hypertension   Mild persistent asthma   Acute respiratory distress   Sepsis ([place])   Acute respiratory distress secondary to COVID-19 . -Chest x-ray this morning showing multifocal opacity significant for COVID-19 pneumonia . -Patient 4 L nasal cannula presentation, currently on room air at rest, discussed with staff, will ambulate to see if he still needs any oxygen requirement with activity -Continue to trend inflammatory markers, CRP significantly  elevated at 19.9 .  Currently trending down which is reassuring. -Continue with IV steroids, have increased to 10 mg oral daily . -Continue with IV remdesivir day # 3/5. -I have discussed with patient at length, encouraged to use incentive spirometry, flutter valve, to get out of bed to chair and to prone once back in bed . -Empiric antibiotics of Rocephin and azithromycin  COVID-19 Labs  Recent Labs    09/09/19 0416 09/10/19 0315 09/11/19 0755  DDIMER 1.14* 1.13* 0.82*  FERRITIN 282  --   --   CRP 19.9* 18.1* 6.7*    Lab Results  Component Value Date   SARSCOV2NAA Detected (A) 09/06/2019    Sepsis: Secondary to above.  -Follow-up blood culture -Empiric antibiotics of Rocephin and azithromycin  Mild persistent asthma: - Patient without wheezing currently noted on physical exam at this time.   -Continue Flovent inhaler  Essential hypertension:  -Continue  Home medications include amlodipine 10 mg daily and losartan 50 mg daily.   Elevated AST:  -Secondary to Covid, monitor closely especially he is on remdesivir  GERD: Patient on Nexium at home. -Pepcid   Code Status : Full  Family Communication  : D/W wife via phone 2/12  Disposition Plan  : home tomorrow  Barriers For Discharge : Remains on  IV steroids and IV remdesivir.  Consults  :  None  Procedures  : None  DVT Prophylaxis  :  [place] lovenox  Lab Results  Component Value Date   PLT 262 09/11/2019    Antibiotics  :    Anti-infectives (From admission, onward)   Start     Dose/Rate Route Frequency Ordered Stop   09/09/19 1000  remdesivir 100 mg in sodium chloride 0.9 % 100 mL IVPB     100 mg 200 mL/hr over 30 Minutes Intravenous Daily 09/08/19 1351 09/13/19 0959   09/08/19 1430  cefTRIAXone (ROCEPHIN) 2 g in sodium chloride 0.9 % 100 mL IVPB     2 g 200 mL/hr over 30 Minutes Intravenous Every 24 hours 09/08/19 1422 09/13/19 1429   09/08/19 1430  azithromycin (ZITHROMAX) 500 mg in sodium  chloride 0.9 % 250 mL IVPB     500 mg 250 mL/hr over 60 Minutes Intravenous Every 24 hours 09/08/19 1422 09/13/19 1429   09/08/19 1345  remdesivir 200 mg in sodium chloride 0.9% 250 mL IVPB     200 mg 580 mL/hr over 30 Minutes Intravenous Once 09/08/19 1351 09/08/19 1519        Objective:   Vitals:   09/10/19 1940 09/11/19 0615 09/11/19 0641 09/11/19 0830  BP: 137/86 122/82 121/86 (!) 133/91  Pulse: 99 90 83 88  Resp: (!) 27 (!) 21  (!) 24  Temp: 98.5 F (36.9 C) 98.4 F (36.9 C) 97.7 F (36.5 C)   TempSrc: Oral Oral Oral   SpO2: 92% 92% 93% 97%  Weight:      Height:        Wt Readings from Last 3 Encounters:  09/08/19 102.1 kg  04/29/19 103.1 kg  08/26/18 103.5 kg     Intake/Output Summary (Last 24 hours) at 09/11/2019 1142 Last data filed at 09/11/2019 0313 Gross per 24 hour  Intake 3 ml  Output 975 ml  Net -972 ml     Physical Exam  Awake Alert, Oriented X 3, No new F.N deficits, Normal affect Symmetrical Chest wall movement, Good air movement bilaterally, CTAB RRR,No Gallops,Rubs or new Murmurs, No Parasternal Heave +ve B.Sounds, Abd Soft, No tenderness, No rebound - guarding or rigidity. No Cyanosis, Clubbing or edema, No new Rash or bruise       Data Review:    CBC Recent Labs  Lab 09/08/19 1015 09/09/19 0416 09/10/19 0315 09/11/19 0755  WBC 10.9* 13.7* 9.9 12.2*  HGB 13.4 12.7* 13.5 13.1  HCT 40.5 38.0* 40.4 38.5*  PLT 170 164 195 262  MCV 90.2 89.8 90.0 87.9  MCH 29.8 30.0 30.1 29.9  MCHC 33.1 33.4 33.4 34.0  RDW 13.7 13.9 13.5 13.4  LYMPHSABS 1.1 1.2 1.1 1.3  MONOABS 0.3 0.3 0.4 0.6  EOSABS 0.0 0.0 0.0 0.0  BASOSABS 0.0 0.0 0.0 0.0    Chemistries  Recent Labs  Lab 09/08/19 1015 09/09/19 0416 09/10/19 0315 09/11/19 0755  NA 137 135 138 139  K 4.4 4.0 4.5 3.8  CL 105 102 104 106  CO2 20* 23 25 22   GLUCOSE 132* 129* 127* 109*  BUN 13 11 20 18   CREATININE 1.09 0.90 1.00 0.83  CALCIUM 8.5* 8.5* 9.0 8.7*  MG  --  1.8  --   --    AST 42* 40 36 33  ALT 29 29 27 27   ALKPHOS 65 66 68 59  BILITOT 0.9 0.7 0.7 0.7   ------------------------------------------------------------------------------------------------------------------ No results for input(s): CHOL, HDL, LDLCALC, TRIG, CHOLHDL, LDLDIRECT in the last 72 hours.  No results found for: HGBA1C ------------------------------------------------------------------------------------------------------------------ No results for input(s): TSH, T4TOTAL, T3FREE, THYROIDAB in the last 72 hours.  Invalid input(s): FREET3 ------------------------------------------------------------------------------------------------------------------ Recent Labs    09/09/19 0416  FERRITIN 282    Coagulation profile No results for input(s): INR, PROTIME in the last 168 hours.  Recent Labs    09/10/19 0315 09/11/19 0755  DDIMER 1.13* 0.82*    Cardiac Enzymes No results for input(s): CKMB, TROPONINI, MYOGLOBIN in the last 168 hours.  Invalid input(s): CK ------------------------------------------------------------------------------------------------------------------ No results found for: BNP  Inpatient Medications  Scheduled Meds: . albuterol  2 puff Inhalation Q6H  . amLODipine  10 mg Oral Daily  . vitamin C  500 mg Oral Daily  . dexamethasone (DECADRON) injection  10 mg Intravenous Daily  . enoxaparin (LOVENOX) injection  40 mg Subcutaneous Q24H  . famotidine  20 mg Oral BID  . fluticasone  2 spray Each Nare Daily  . loratadine  10 mg Oral Daily  . losartan  50 mg Oral Daily  . mometasone-formoterol  2 puff Inhalation BID  . montelukast  10 mg Oral QHS  . sodium chloride flush  3 mL Intravenous Q12H  . zinc sulfate  220 mg Oral Daily   Continuous Infusions: . azithromycin 500 mg (09/10/19 1458)  . cefTRIAXone (ROCEPHIN)  IV 2 g (09/10/19 1404)  . remdesivir 100 mg in NS 100 mL 100 mg (09/11/19 0835)   PRN Meds:.acetaminophen, chlorpheniramine-HYDROcodone,  guaiFENesin-dextromethorphan, ondansetron **OR** ondansetron (ZOFRAN) IV  Micro Results Recent Results (from the past 240 hour(s))  Novel Coronavirus, NAA ([facility])     Status: Abnormal   Collection Time: 09/06/19  2:31 PM   Specimen: Nasopharyngeal(NP) swabs in vial transport medium   NASOPHARYNGE  TESTING  Result Value Ref Range Status   SARS-CoV-2, NAA Detected (A) Not Detected Final    Comment: This nucleic acid amplification test was developed and its performance characteristics determined by 11/08/19. Nucleic acid amplification tests include RT-PCR and TMA. This test has not been FDA cleared or approved. This test has been authorized by FDA under an Emergency Use Authorization (EUA). This test is only authorized for the duration of time the declaration that circumstances exist justifying the authorization of the emergency use of in vitro diagnostic tests for detection of SARS-CoV-2 virus and/or diagnosis of COVID-19 infection under section 564(b)(1) of the Act, 21 U.S.C. 09/13/19) (1), unless the authorization is terminated or revoked sooner. When diagnostic testing is negative, the possibility of a false negative result should be considered in  the context of a patient's recent exposures and the presence of clinical signs and symptoms consistent with COVID-19. An individual without symptoms of COVID-19 and who is not shedding SARS-CoV-2 virus wo uld expect to have a negative (not detected) result in this assay.   Blood Culture (routine x 2)     Status: None (Preliminary result)   Collection Time: 09/08/19 10:45 AM   Specimen: BLOOD RIGHT FOREARM  Result Value Ref Range Status   Specimen Description BLOOD RIGHT FOREARM  Final   Special Requests   Final    BOTTLES DRAWN AEROBIC AND ANAEROBIC Blood Culture adequate volume   Culture   Final    NO GROWTH 2 DAYS Performed at [facility], [address], [place], [place] [address]    Report Status PENDING   Incomplete  Blood Culture (routine x 2)     Status: None (Preliminary result)   Collection Time: 09/08/19 10:55 AM   Specimen: BLOOD  Result Value Ref Range Status   Specimen Description BLOOD RIGHT ANTECUBITAL  Final   Special Requests   Final    BOTTLES DRAWN AEROBIC AND ANAEROBIC Blood Culture adequate volume   Culture   Final    NO GROWTH 2 DAYS Performed at [facility], [address] [place], [place] [address]    Report Status PENDING  Incomplete  MRSA PCR Screening     Status: None   Collection Time: 09/11/19  3:24 AM   Specimen: Nasal Mucosa; Nasopharyngeal  Result Value Ref Range Status   MRSA by PCR NEGATIVE NEGATIVE Final    Comment:        The GeneXpert MRSA Assay (FDA approved for NASAL specimens only), is one component of a comprehensive MRSA colonization surveillance program. It is not intended to diagnose MRSA infection nor to guide or monitor treatment for MRSA infections. Performed at [facility], [address], [place], [place] [address]     Radiology Reports DG Chest [place] 1 View  Result Date: 09/10/2019 CLINICAL DATA:  Cough and COVID-19 positivity EXAM: PORTABLE CHEST 1 VIEW COMPARISON:  09/08/2019 FINDINGS: Cardiac shadow is stable. Patchy opacities are noted slightly increased when compared with the prior exam consistent with the given clinical history of COVID-19 positivity. No sizable effusion is noted. No bony abnormality is seen. IMPRESSION: Increased parenchymal opacity particularly on the right consistent with the given clinical history. Electronically Signed   By: Alcide Clever M.D.   On: 09/10/2019 08:58   DG Chest Port 1 View  Result Date: 09/08/2019 CLINICAL DATA:  Shortness of breath.  COVID-19 positive EXAM: PORTABLE CHEST 1 VIEW COMPARISON:  Dec 14, 2013. FINDINGS: Degree of inspiration is shallow. There is slight right base atelectasis. Lungs elsewhere clear. Heart size and pulmonary vascularity are normal. No adenopathy. No  bone lesions. IMPRESSION: Shallow degree of inspiration. A degree of underlying restrictive disease cannot be excluded. Slight right base atelectasis. Lungs elsewhere clear. Cardiac silhouette normal. Electronically Signed   By: Bretta Bang III M.D.   On: 09/08/2019 10:12      Huey Bienenstock M.D on 09/11/2019 at 11:42 AM  Between 7am to 7pm - Pager - [phone]  After 7pm go to [email or web address] - password [facility]  [facility] -  Office  [phone]

## 2019-09-11 NOTE — Progress Notes (Signed)
This nurse offered to contact the patient's point of contact and update them on his status and plan of care.  Patient stated that he has already brought his family up to date on his status.  Patient alert, oriented, and able to update his family on his own as he sees appropriate.

## 2019-09-11 NOTE — Plan of Care (Signed)

## 2019-09-11 NOTE — Progress Notes (Signed)
Patient ambulated 250 feet around nursing unit. Had desaturation to 86% but otherwise no issues. Patient returned to chair in room, saturations back to mid 90's at rest. Will continue to monitor.  Lyndal Pulley, RN 09/11/2019 4:54 PM

## 2019-09-12 LAB — COMPREHENSIVE METABOLIC PANEL
ALT: 32 U/L (ref 0–44)
AST: 36 U/L (ref 15–41)
Albumin: 2.6 g/dL — ABNORMAL LOW (ref 3.5–5.0)
Alkaline Phosphatase: 64 U/L (ref 38–126)
Anion gap: 9 (ref 5–15)
BUN: 14 mg/dL (ref 6–20)
CO2: 22 mmol/L (ref 22–32)
Calcium: 8.7 mg/dL — ABNORMAL LOW (ref 8.9–10.3)
Chloride: 108 mmol/L (ref 98–111)
Creatinine, Ser: 0.84 mg/dL (ref 0.61–1.24)
GFR calc Af Amer: >60 mL/min (ref >60)
GFR calc non Af Amer: >60 mL/min (ref >60)
Glucose, Bld: 110 mg/dL — ABNORMAL HIGH (ref 70–99)
Potassium: 4 mmol/L (ref 3.5–5.1)
Sodium: 139 mmol/L (ref 135–145)
Total Bilirubin: 0.6 mg/dL (ref 0.3–1.2)
Total Protein: 6.3 g/dL — ABNORMAL LOW (ref 6.5–8.1)

## 2019-09-12 LAB — CBC WITH DIFFERENTIAL/PLATELET
Abs Immature Granulocytes: 0.16 10*3/uL — ABNORMAL HIGH (ref 0.00–0.07)
Basophils Absolute: 0 10*3/uL (ref 0.0–0.1)
Basophils Relative: 0 %
Eosinophils Absolute: 0 10*3/uL (ref 0.0–0.5)
Eosinophils Relative: 0 %
HCT: 39.6 % (ref 39.0–52.0)
Hemoglobin: 13.1 g/dL (ref 13.0–17.0)
Immature Granulocytes: 2 %
Lymphocytes Relative: 14 %
Lymphs Abs: 1.4 10*3/uL (ref 0.7–4.0)
MCH: 29.5 pg (ref 26.0–34.0)
MCHC: 33.1 g/dL (ref 30.0–36.0)
MCV: 89.2 fL (ref 80.0–100.0)
Monocytes Absolute: 0.7 10*3/uL (ref 0.1–1.0)
Monocytes Relative: 7 %
Neutro Abs: 7.7 10*3/uL (ref 1.7–7.7)
Neutrophils Relative %: 77 %
Platelets: 272 10*3/uL (ref 150–400)
RBC: 4.44 MIL/uL (ref 4.22–5.81)
RDW: 13.2 % (ref 11.5–15.5)
WBC: 10 10*3/uL (ref 4.0–10.5)
nRBC: 0 % (ref 0.0–0.2)

## 2019-09-12 LAB — D-DIMER, QUANTITATIVE: D-Dimer, Quant: 0.68 ug/mL-FEU — ABNORMAL HIGH (ref 0.00–0.50)

## 2019-09-12 LAB — C-REACTIVE PROTEIN: CRP: 3.7 mg/dL — ABNORMAL HIGH (ref <1.0)

## 2019-09-12 MED ORDER — ACETAMINOPHEN 325 MG PO TABS: 650.0000 mg | ORAL_TABLET | Freq: Four times a day (QID) | ORAL | Status: AC | PRN

## 2019-09-12 MED ORDER — DEXAMETHASONE 6 MG PO TABS: 6.0000 mg | ORAL_TABLET | Freq: Every day | ORAL | 0 refills | Status: DC

## 2019-09-12 NOTE — Care Management (Signed)
Referral faxed to [facility] to Home program.

## 2019-09-12 NOTE — Discharge Instructions (Signed)
Person Under Monitoring Name: Isaiah Sellers  Location: [address] [place] [address]   Infection Prevention Recommendations for Individuals Confirmed to have, or Being Evaluated for, 2019 Novel Coronavirus (COVID-19) Infection Who Receive Care at Home  Individuals who are confirmed to have, or are being evaluated for, COVID-19 should follow the prevention steps below until a healthcare provider or local or state health department says they can return to normal activities.  Stay home except to get medical care You should restrict activities outside your home, except for getting medical care. Do not go to work, school, or public areas, and do not use public transportation or taxis.  Call ahead before visiting your doctor Before your medical appointment, call the healthcare provider and tell them that you have, or are being evaluated for, COVID-19 infection. This will help the healthcare provider's office take steps to keep other people from getting infected. Ask your healthcare provider to call the local or state health department.  Monitor your symptoms Seek prompt medical attention if your illness is worsening (e.g., difficulty breathing). Before going to your medical appointment, call the healthcare provider and tell them that you have, or are being evaluated for, COVID-19 infection. Ask your healthcare provider to call the local or state health department.  Wear a facemask You should wear a facemask that covers your nose and mouth when you are in the same room with other people and when you visit a healthcare provider. People who live with or visit you should also wear a facemask while they are in the same room with you.  Separate yourself from other people in your home As much as possible, you should stay in a different room from other people in your home. Also, you should use a separate bathroom, if available.  Avoid sharing household items You should not  share dishes, drinking glasses, cups, eating utensils, towels, bedding, or other items with other people in your home. After using these items, you should wash them thoroughly with soap and water.  Cover your coughs and sneezes Cover your mouth and nose with a tissue when you cough or sneeze, or you can cough or sneeze into your sleeve. Throw used tissues in a lined trash can, and immediately wash your hands with soap and water for at least 20 seconds or use an alcohol-based hand rub.  Wash your [employer] your hands often and thoroughly with soap and water for at least 20 seconds. You can use an alcohol-based hand sanitizer if soap and water are not available and if your hands are not visibly dirty. Avoid touching your eyes, nose, and mouth with unwashed hands.   Prevention Steps for Caregivers and Household Members of Individuals Confirmed to have, or Being Evaluated for, COVID-19 Infection Being Cared for in the Home  If you live with, or provide care at home for, a person confirmed to have, or being evaluated for, COVID-19 infection please follow these guidelines to prevent infection:  Follow healthcare provider's instructions Make sure that you understand and can help the patient follow any healthcare provider instructions for all care.  Provide for the patient's basic needs You should help the patient with basic needs in the home and provide support for getting groceries, prescriptions, and other personal needs.  Monitor the patient's symptoms If they are getting sicker, call his or her medical provider and tell them that the patient has, or is being evaluated for, COVID-19 infection. This will help the healthcare provider's  office take steps to keep other people from getting infected. Ask the healthcare provider to call the local or state health department.  Limit the number of people who have contact with the patient  If possible, have only one caregiver for the  patient.  Other household members should stay in another home or place of residence. If this is not possible, they should stay  in another room, or be separated from the patient as much as possible. Use a separate bathroom, if available.  Restrict visitors who do not have an essential need to be in the home.  Keep older adults, very young children, and other sick people away from the patient Keep older adults, very young children, and those who have compromised immune systems or chronic health conditions away from the patient. This includes people with chronic heart, lung, or kidney conditions, diabetes, and cancer.  Ensure good ventilation Make sure that shared spaces in the home have good air flow, such as from an air conditioner or an opened window, weather permitting.  Wash your hands often  Wash your hands often and thoroughly with soap and water for at least 20 seconds. You can use an alcohol based hand sanitizer if soap and water are not available and if your hands are not visibly dirty.  Avoid touching your eyes, nose, and mouth with unwashed hands.  Use disposable paper towels to dry your hands. If not available, use dedicated cloth towels and replace them when they become wet.  Wear a facemask and gloves  Wear a disposable facemask at all times in the room and gloves when you touch or have contact with the patient's blood, body fluids, and/or secretions or excretions, such as sweat, saliva, sputum, nasal mucus, vomit, urine, or feces.  Ensure the mask fits over your nose and mouth tightly, and do not touch it during use.  Throw out disposable facemasks and gloves after using them. Do not reuse.  Wash your hands immediately after removing your facemask and gloves.  If your personal clothing becomes contaminated, carefully remove clothing and launder. Wash your hands after handling contaminated clothing.  Place all used disposable facemasks, gloves, and other waste in a lined  container before disposing them with other household waste.  Remove gloves and wash your hands immediately after handling these items.  Do not share dishes, glasses, or other household items with the patient  Avoid sharing household items. You should not share dishes, drinking glasses, cups, eating utensils, towels, bedding, or other items with a patient who is confirmed to have, or being evaluated for, COVID-19 infection.  After the person uses these items, you should wash them thoroughly with soap and water.  Wash laundry thoroughly  Immediately remove and wash clothes or bedding that have blood, body fluids, and/or secretions or excretions, such as sweat, saliva, sputum, nasal mucus, vomit, urine, or feces, on them.  Wear gloves when handling laundry from the patient.  Read and follow directions on labels of laundry or clothing items and detergent. In general, wash and dry with the warmest temperatures recommended on the label.  Clean all areas the individual has used often  Clean all touchable surfaces, such as counters, tabletops, doorknobs, bathroom fixtures, toilets, phones, keyboards, tablets, and bedside tables, every day. Also, clean any surfaces that may have blood, body fluids, and/or secretions or excretions on them.  Wear gloves when cleaning surfaces the patient has come in contact with.  Use a diluted bleach solution (e.g., dilute bleach with 1  part bleach and 10 parts water) or a household disinfectant with a label that says EPA-registered for coronaviruses. To make a bleach solution at home, add 1 tablespoon of bleach to 1 quart (4 cups) of water. For a larger supply, add  cup of bleach to 1 gallon (16 cups) of water.  Read labels of cleaning products and follow recommendations provided on product labels. Labels contain instructions for safe and effective use of the cleaning product including precautions you should take when applying the product, such as wearing gloves or  eye protection and making sure you have good ventilation during use of the product.  Remove gloves and wash hands immediately after cleaning.  Monitor yourself for signs and symptoms of illness Caregivers and household members are considered close contacts, should monitor their health, and will be asked to limit movement outside of the home to the extent possible. Follow the monitoring steps for close contacts listed on the symptom monitoring form.   ? If you have additional questions, contact your local health department or call the epidemiologist on call at [phone] (available 24/7). ? This guidance is subject to change. For the most up-to-date guidance from [facility], please refer to their website: [email or web address]

## 2019-09-12 NOTE — Discharge Summary (Signed)
Isaiah Sellers, is a 59 y.o. male  DOB 1961-01-01  MRN [id number].  Admission date:  09/08/2019  Admitting Physician  Clydie Braun, MD  Discharge Date:  09/12/2019   Primary MD  Andreas Blower., MD  Recommendations for primary care physician for things to follow:  - please check CBC, CMP during next visit   Admission Diagnosis  COVID-19 virus infection [U07.1] COVID-19 [U07.1]   Discharge Diagnosis  COVID-19 virus infection [U07.1] COVID-19 [U07.1]    Principal Problem:   COVID-19 virus infection Active Problems:   Essential hypertension   Mild persistent asthma   Acute respiratory distress   Sepsis (HCC)      Past Medical History:  Diagnosis Date  . Asthma   . Chronic bronchitis (HCC)   . Deviated septum   . Hypertension   . Nasal turbinate hypertrophy     Past Surgical History:  Procedure Laterality Date  . ENDOSCOPIC CONCHA BULLOSA RESECTION Right 08/18/2018   Procedure: ENDOSCOPIC RIGHT CONCHA BULLOSA RESECTION;  Surgeon: Newman Pies, MD;  Location: [facility];  Service: ENT;  Laterality: Right;  . ETHMOIDECTOMY Bilateral 08/18/2018   Procedure: TOTAL ETHMOIDECTOMY AND FRONTAL RECESS EXPLORATION;  Surgeon: Newman Pies, MD;  Location: [facility];  Service: ENT;  Laterality: Bilateral;  . MAXILLARY ANTROSTOMY Bilateral 08/18/2018   Procedure: ENDOSCOPIC MAXILLARY ANTROSTOMY WITH TISSUE REMOVAL;  Surgeon: Newman Pies, MD;  Location: [facility];  Service: ENT;  Laterality: Bilateral;  . NASAL SEPTOPLASTY W/ TURBINOPLASTY Bilateral 08/18/2018   Procedure: NASAL SEPTOPLASTY WITH BILATERAL TURBINATE REDUCTION;  Surgeon: Newman Pies, MD;  Location: [facility];  Service: ENT;  Laterality: Bilateral;  . SINUS ENDO WITH FUSION Bilateral 08/18/2018   Procedure: SINUS ENDOSCOPY WITH FUSION NAVIGATION;  Surgeon: Newman Pies, MD;  Location:   SURGERY CENTER;  Service: ENT;  Laterality: Bilateral;       History of present illness and  Hospital Course:     Kindly see H&P for history of present illness and admission details, please review complete Labs, Consult reports and Test reports for all details in brief  HPI  from the history and physical done on the day of admission    Hospital Course   58 y.o.malewith medical history significant ofhypertension, asthma, chronic bronchitis, and reflux. Patient presents with complaints of cough and progressively worsening shortness of breath . He was diagnosed with COVID-19 on 2/8. He had been prescribed a prednisone taper forwhich he had taken 2 days worth of medication by his allergist Dr. Beaulah Dinning. Upon admission into the emergency department patient was noted to be febrile up to 101.7 F, pulse 110-120, respiration 22-28, blood pressure stable, and O2 saturation maintained on room air.  But later he did require 4 L nasal cannula upon admission to the floor.  Patient admitted for further management.      Acute respiratory distress secondary to COVID-19 . -Chest x-ray this morning showing multifocal opacity significant for COVID-19 pneumonia . -Patient 4 L nasal cannula  presentation, is currently on room air, with no further oxygen requirement. -Continue to trend inflammatory markers, CRP significantly elevated at 19.9 on presentation .  Currently trending down which is reassuring. -He was treated with IV steroids during hospital stay, to finish another 5 days of oral Decadron for total of 10 days treatment . -Finished 5 days of IV remdesivir  -Patient was encouraged to keep using his incentive spirometry and flutter valve after discharge . -Empiric antibiotics of Rocephin and azithromycin X 5 days.  COVID-19 Labs  Recent Labs    09/10/19 0315 09/11/19 0755 09/12/19 0315  DDIMER 1.13* 0.82* 0.68*  CRP 18.1* 6.7* 3.7*    Lab Results  Component Value Date    SARSCOV2NAA Detected (A) 09/06/2019    Sepsis:  -Secondary to above. Patient on admission with elevated lactic acid, leukocytosis, fever of 1-1.7, tachycardia and tachypnea -Resolved -Empiric antibiotics of Rocephin and azithromycin X 5 days  Mild persistent asthma - Patient without wheezing currently noted on physical examat this time. -Continue Flovent inhaler  Essential hypertension:  -Continue  Home medications include amlodipine 10 mg daily and losartan 50 mg daily.  Elevated AST:  -Secondary to Covid,Resolved  GERD: Patient on Nexium at home. -Pepcid  Discharge Condition:  Stable   Follow UP  Follow-up Information    Andreas Blower., MD Follow up in 2 week(s).   Specialty: Internal Medicine Contact information: [address] [place] [address] [phone]             Discharge Instructions  and  Discharge Medications     Discharge Instructions    Discharge instructions   Complete by: As directed    Follow with Primary MD Andreas Blower., MD in 14 days   Get CBC, CMP, checked  by Primary MD next visit.    Activity: As tolerated with Full fall precautions use walker/cane & assistance as needed   Disposition Home    Diet: Heart Healthy , with feeding assistance and aspiration precautions.  For Heart failure patients - Check your Weight same time everyday, if you gain over 2 pounds, or you develop in leg swelling, experience more shortness of breath or chest pain, call your Primary MD immediately. Follow Cardiac Low Salt Diet and 1.5 lit/day fluid restriction.   On your next visit with your primary care physician please Get Medicines reviewed and adjusted.   Please request your Prim.MD to go over all Hospital Tests and Procedure/Radiological results at the follow up, please get all Hospital records sent to your Prim MD by signing hospital release before you go home.   If you experience worsening of your admission  symptoms, develop shortness of breath, life threatening emergency, suicidal or homicidal thoughts you must seek medical attention immediately by calling 911 or calling your MD immediately  if symptoms less severe.  You Must read complete instructions/literature along with all the possible adverse reactions/side effects for all the Medicines you take and that have been prescribed to you. Take any new Medicines after you have completely understood and accpet all the possible adverse reactions/side effects.   Do not drive, operating heavy machinery, perform activities at heights, swimming or participation in water activities or provide baby sitting services if your were admitted for syncope or siezures until you have seen by Primary MD or a Neurologist and advised to do so again.  Do not drive when taking Pain medications.    Do not take more than prescribed Pain, Sleep and Anxiety  Medications  Special Instructions: If you have smoked or chewed Tobacco  in the last 2 yrs please stop smoking, stop any regular Alcohol  and or any Recreational drug use.  Wear Seat belts while driving.   Please note  You were cared for by a hospitalist during your hospital stay. If you have any questions about your discharge medications or the care you received while you were in the hospital after you are discharged, you can call the unit and asked to speak with the hospitalist on call if the hospitalist that took care of you is not available. Once you are discharged, your primary care physician will handle any further medical issues. Please note that NO REFILLS for any discharge medications will be authorized once you are discharged, as it is imperative that you return to your primary care physician (or establish a relationship with a primary care physician if you do not have one) for your aftercare needs so that they can reassess your need for medications and monitor your lab values.   Increase activity slowly    Complete by: As directed      Allergies as of 09/12/2019      Reactions   Chocolate Rash   Other Rash   All carbonated drinks (soda)      Medication List    STOP taking these medications   doxycycline 100 MG capsule Commonly known as: VIBRAMYCIN   ibuprofen 200 MG tablet Commonly known as: ADVIL     TAKE these medications   acetaminophen 325 MG tablet Commonly known as: TYLENOL Take 2 tablets (650 mg total) by mouth every 6 (six) hours as needed for mild pain or headache (fever >/= 101).   albuterol 108 (90 Base) MCG/ACT inhaler Commonly known as: VENTOLIN HFA INHALE 2 PUFFS INTO THE LUNGS EVERY 4 HOURS AS NEEDED FOR WHEEZING OR SHORTNESS OF BREATH   amLODipine 10 MG tablet Commonly known as: NORVASC Take 10 mg by mouth daily.   azelastine 0.1 % nasal spray Commonly known as: ASTELIN 1-2 sprays per nostril 2 times daily as needed   budesonide-formoterol 160-4.5 MCG/ACT inhaler Commonly known as: Symbicort Inhale 2 puffs into the lungs 2 (two) times daily. Use with spacer.Rinse, gargle and spit out after use   cetirizine 10 MG tablet Commonly known as: ZYRTEC Take 1 tablet (10 mg total) by mouth as needed. What changed:   when to take this  reasons to take this   clindamycin 1 % lotion Commonly known as: CLEOCIN T Apply 1 application topically as needed (for skin (acne)).   DAILY MULTIVITAMIN PO Take 1 tablet by mouth daily.   dexamethasone 6 MG tablet Commonly known as: DECADRON Take 1 tablet (6 mg total) by mouth daily. Start taking on: September 13, 2019   esomeprazole 20 MG packet Commonly known as: NEXIUM Take 20 mg by mouth daily before breakfast.   fluocinonide 0.05 % external solution Commonly known as: LIDEX Apply 1 application topically daily as needed (for scalp irritation).   fluticasone 50 MCG/ACT nasal spray Commonly known as: FLONASE Place 2 sprays into both nostrils daily.   losartan 50 MG tablet Commonly known as: COZAAR Take  50 mg by mouth daily.   montelukast 10 MG tablet Commonly known as: Singulair 1 tablet once at night for coughing or wheezing.   Spacer/Aero-Holding [employer] Use as directed with inhaler   VITAMIN D PO Take 1,000 Units by mouth daily.         Diet and Activity  recommendation: See Discharge Instructions above   Consults obtained -  None   Major procedures and Radiology Reports - PLEASE review detailed and final reports for all details, in brief -      DG Chest Port 1 View  Result Date: 09/10/2019 CLINICAL DATA:  Cough and COVID-19 positivity EXAM: PORTABLE CHEST 1 VIEW COMPARISON:  09/08/2019 FINDINGS: Cardiac shadow is stable. Patchy opacities are noted slightly increased when compared with the prior exam consistent with the given clinical history of COVID-19 positivity. No sizable effusion is noted. No bony abnormality is seen. IMPRESSION: Increased parenchymal opacity particularly on the right consistent with the given clinical history. Electronically Signed   By: Alcide Clever M.D.   On: 09/10/2019 08:58   DG Chest Port 1 View  Result Date: 09/08/2019 CLINICAL DATA:  Shortness of breath.  COVID-19 positive EXAM: PORTABLE CHEST 1 VIEW COMPARISON:  Dec 14, 2013. FINDINGS: Degree of inspiration is shallow. There is slight right base atelectasis. Lungs elsewhere clear. Heart size and pulmonary vascularity are normal. No adenopathy. No bone lesions. IMPRESSION: Shallow degree of inspiration. A degree of underlying restrictive disease cannot be excluded. Slight right base atelectasis. Lungs elsewhere clear. Cardiac silhouette normal. Electronically Signed   By: Bretta Bang III M.D.   On: 09/08/2019 10:12    Micro Results    Recent Results (from the past 240 hour(s))  Novel Coronavirus, NAA ([facility])     Status: Abnormal   Collection Time: 09/06/19  2:31 PM   Specimen: Nasopharyngeal(NP) swabs in vial transport medium   NASOPHARYNGE  TESTING  Result Value Ref Range  Status   SARS-CoV-2, NAA Detected (A) Not Detected Final    Comment: This nucleic acid amplification test was developed and its performance characteristics determined by [employer]. Nucleic acid amplification tests include RT-PCR and TMA. This test has not been FDA cleared or approved. This test has been authorized by FDA under an Emergency Use Authorization (EUA). This test is only authorized for the duration of time the declaration that circumstances exist justifying the authorization of the emergency use of in vitro diagnostic tests for detection of SARS-CoV-2 virus and/or diagnosis of COVID-19 infection under section 564(b)(1) of the Act, 21 U.S.C. 762GBT-5(V) (1), unless the authorization is terminated or revoked sooner. When diagnostic testing is negative, the possibility of a false negative result should be considered in the context of a patient's recent exposures and the presence of clinical signs and symptoms consistent with COVID-19. An individual without symptoms of COVID-19 and who is not shedding SARS-CoV-2 virus wo uld expect to have a negative (not detected) result in this assay.   Blood Culture (routine x 2)     Status: None (Preliminary result)   Collection Time: 09/08/19 10:45 AM   Specimen: BLOOD RIGHT FOREARM  Result Value Ref Range Status   Specimen Description BLOOD RIGHT FOREARM  Final   Special Requests   Final    BOTTLES DRAWN AEROBIC AND ANAEROBIC Blood Culture adequate volume   Culture   Final    NO GROWTH 3 DAYS Performed at [facility], [address] [place], [place] [address]    Report Status PENDING  Incomplete  Blood Culture (routine x 2)     Status: None (Preliminary result)   Collection Time: 09/08/19 10:55 AM   Specimen: BLOOD  Result Value Ref Range Status   Specimen Description BLOOD RIGHT ANTECUBITAL  Final   Special Requests   Final    BOTTLES DRAWN AEROBIC AND ANAEROBIC  Blood Culture adequate volume   Culture   Final     NO GROWTH 3 DAYS Performed at [facility], [place] [address], [place], [place] [address]    Report Status PENDING  Incomplete  MRSA PCR Screening     Status: None   Collection Time: 09/11/19  3:24 AM   Specimen: Nasal Mucosa; Nasopharyngeal  Result Value Ref Range Status   MRSA by PCR NEGATIVE NEGATIVE Final    Comment:        The GeneXpert MRSA Assay (FDA approved for NASAL specimens only), is one component of a comprehensive MRSA colonization surveillance program. It is not intended to diagnose MRSA infection nor to guide or monitor treatment for MRSA infections. Performed at [facility], [place] [address] [place], [place] [address]        Today   Subjective:   Isaiah Sellers today has no headache,no chest abdominal pain,no new weakness tingling or numbness, feels much better wants to go home today.   Objective:   Blood pressure (!) 144/91, pulse 77, temperature 98 F (36.7 C), temperature source Oral, resp. rate (!) 23, height 6\' 1"  (1.854 m), weight 102.1 kg, SpO2 96 %.   Intake/Output Summary (Last 24 hours) at 09/12/2019 1140 Last data filed at 09/11/2019 2309 Gross per 24 hour  Intake 693 ml  Output 1050 ml  Net -357 ml    Exam Awake Alert, Oriented x 3, No new F.N deficits, Normal affect Symmetrical Chest wall movement, Good air movement bilaterally, CTAB RRR,No Gallops,Rubs or new Murmurs, No Parasternal Heave +ve B.Sounds, Abd Soft, Non tender,  No Cyanosis, Clubbing or edema, No new Rash or bruise  Data Review   CBC w Diff:  Lab Results  Component Value Date   WBC 10.0 09/12/2019   HGB 13.1 09/12/2019   HCT 39.6 09/12/2019   PLT 272 09/12/2019   LYMPHOPCT 14 09/12/2019   MONOPCT 7 09/12/2019   EOSPCT 0 09/12/2019   BASOPCT 0 09/12/2019    CMP:  Lab Results  Component Value Date   NA 139 09/12/2019   K 4.0 09/12/2019   CL 108 09/12/2019   CO2 22 09/12/2019   BUN 14 09/12/2019   CREATININE 0.84 09/12/2019   PROT 6.3 (L)  09/12/2019   ALBUMIN 2.6 (L) 09/12/2019   BILITOT 0.6 09/12/2019   ALKPHOS 64 09/12/2019   AST 36 09/12/2019   ALT 32 09/12/2019  .   Total Time in preparing paper work, data evaluation and todays exam - 15 minutes  Phillips Climes M.D on 09/12/2019 at 11:40 AM  [facility]   Office  [phone]

## 2019-09-12 NOTE — Progress Notes (Signed)
Isaiah Sellers to be D/C'd Home per MD order.  Discussed with the patient and all questions fully answered.  VSS, Skin clean, dry and intact without evidence of skin break down, no evidence of skin tears noted. IV catheter discontinued intact. Site without signs and symptoms of complications. Dressing and pressure applied.  An After Visit Summary was printed and given to the patient.  D/c education completed with patient/family including follow up instructions, medication list, d/c activities limitations if indicated, with other d/c instructions as indicated by MD - patient able to verbalize understanding, all questions fully answered.   Patient instructed to return to ED, call 911, or call MD for any changes in condition.   Patient escorted via WC, and D/C home via private auto.  Isaiah Sellers 09/12/2019 12:05 PM

## 2019-09-13 LAB — CULTURE, BLOOD (ROUTINE X 2)
Culture: NO GROWTH
Culture: NO GROWTH
Special Requests: ADEQUATE
Special Requests: ADEQUATE

## 2019-11-15 ENCOUNTER — Other Ambulatory Visit: Payer: Self-pay

## 2019-11-15 MED ORDER — ALBUTEROL SULFATE HFA 108 (90 BASE) MCG/ACT IN AERS: INHALATION_SPRAY | RESPIRATORY_TRACT | 1 refills | Status: DC

## 2020-02-04 ENCOUNTER — Other Ambulatory Visit: Payer: Self-pay | Admitting: Family Medicine

## 2020-05-14 ENCOUNTER — Other Ambulatory Visit: Payer: Self-pay | Admitting: Family Medicine

## 2020-05-31 ENCOUNTER — Other Ambulatory Visit: Payer: Self-pay | Admitting: Family Medicine

## 2020-08-18 ENCOUNTER — Other Ambulatory Visit: Payer: Self-pay | Admitting: Family Medicine

## 2020-08-20 ENCOUNTER — Other Ambulatory Visit: Payer: Self-pay | Admitting: Allergy and Immunology

## 2020-08-21 ENCOUNTER — Other Ambulatory Visit: Payer: Self-pay

## 2020-08-21 ENCOUNTER — Telehealth: Payer: Self-pay | Admitting: Allergy and Immunology

## 2020-08-21 NOTE — Telephone Encounter (Signed)
Pt called back and explained to him he was told to stop flovent at last visit and start symbicort and I could send in a refill of symbicort. He stated understanding and will see [place] a this follow up in 2 weeks

## 2020-08-21 NOTE — Telephone Encounter (Signed)
Tried calling number listed it is a non working number reached out to wife and informed her to have pt call me  she stated she would

## 2020-08-21 NOTE — Telephone Encounter (Signed)
Pt request refill for flovent, has appt scheduled 2/10.

## 2020-09-07 ENCOUNTER — Other Ambulatory Visit: Payer: Self-pay

## 2020-09-07 ENCOUNTER — Ambulatory Visit: Payer: 59 | Admitting: Allergy & Immunology

## 2020-09-07 ENCOUNTER — Encounter: Payer: Self-pay | Admitting: Allergy & Immunology

## 2020-09-07 VITALS — BP 120/76 | HR 94 | Temp 98.4°F | Resp 16 | Ht 72.0 in | Wt 229.2 lb

## 2020-09-07 DIAGNOSIS — J3089 Other allergic rhinitis: Secondary | ICD-10-CM

## 2020-09-07 DIAGNOSIS — K219 Gastro-esophageal reflux disease without esophagitis: Secondary | ICD-10-CM

## 2020-09-07 DIAGNOSIS — J4531 Mild persistent asthma with (acute) exacerbation: Secondary | ICD-10-CM

## 2020-09-07 MED ORDER — ALBUTEROL SULFATE HFA 108 (90 BASE) MCG/ACT IN AERS: 2.0000 | INHALATION_SPRAY | RESPIRATORY_TRACT | 1 refills | Status: DC | PRN

## 2020-09-07 NOTE — Progress Notes (Signed)
FOLLOW UP  Date of Service/Encounter:  09/07/20   Assessment:   Mild persistent asthma, uncomplicated  Allergic rhinitis   GERD  Plan/Recommendations:   1. Mild persistent asthma, uncomplicated - Lung testing looks fairly decent.  - We are not going to make any medication changes at all.  - Daily controller medication(s): Singulair 10mg  daily - Prior to physical activity: Ventolin 2 puffs 10-15 minutes before physical activity. - Rescue medications: Ventolin 4 puffs every 4-6 hours as needed - Changes during respiratory infections or worsening symptoms: Add on Symbicort 160/4.38mcg two puffs twice daily for TWO WEEKS. - Asthma control goals:  * Full participation in all desired activities (may need albuterol before activity) * Albuterol use two time or less a week on average (not counting use with activity) * Cough interfering with sleep two time or less a month * Oral steroids no more than once a year * No hospitalizations  2. Other allergic rhinitis - Continue with Dymista one spray per nostril daily as needed. - Continue with Singulair (montelukast) 10mg  daily.   3. Gastroesophageal reflux disease - Continue with Nexium daily.  4. Follow up in one year or earlier if needed.   Subjective:   Isaiah Sellers is a 60 y.o. male presenting today for follow up of  Chief Complaint  Patient presents with  . Asthma    Isaiah Sellers has a history of the following: Patient Active Problem List   Diagnosis Date Noted  . COVID-19 virus infection 09/08/2019  . Acute respiratory distress 09/08/2019  . Sepsis (HCC) 09/08/2019  . At increased risk of exposure to COVID-19 virus 09/06/2019  . Cough, persistent 11/26/2018  . Gastroesophageal reflux disease 11/26/2018  . Asthma with acute exacerbation 12/03/2017  . Mild persistent asthma 11/18/2016  . Acute maxillary sinusitis 05/29/2016  . Other allergic rhinitis 01/03/2016  . Essential hypertension 01/03/2016     History obtained from: chart review and patient.  Isaiah Sellers is a 60 y.o. male presenting for a follow up visit.  He was last seen in February 2021 by one of our nurse practitioners.  At that time, she restarted montelukast 10 mg daily and started Symbicort 160 mcg 2 puffs twice daily.  He was also started on a prednisone burst.  For his allergic rhinitis, would continue with Zyrtec 10 mg daily as well as Astelin.  He was also continued on Nexium 20 mg daily.  Since last visit, he has mostly done well. He was actually admitted to the hospital for COVID19 one year ago. He was having problems with his breathing, but he was afebrile.   Asthma/Respiratory Symptom History: He has done well despite his COVID-19 diagnosis 1 year ago.  He remains on the Symbicort, which he has only during respiratory flares.  He has gone through maybe three or four inhalers in the last year.  This has kept him out of the hospital and he has not required any antibiotics or steroids since last visit.  He reports a good night.  Allergic Rhinitis Symptom History: He remains on his Zyrtec daily.  He does not use Astelin quite as much.. He had a surgery for a deviated septum around two years ago. He had a follow up appt with Dr. 46 a couple of weeks ago. There was concern that he might have polyps, but he did not have any last time. He does have a nasal steroid but he does not use anything consistently.   GERD Symptom History: He remains on  the Nexium once daily.  Overall, this controls his symptoms fairly well.  Otherwise, there have been no changes to his past medical history, surgical history, family history, or social history.    Review of Systems  Constitutional: Negative.  Negative for chills, fever, malaise/fatigue and weight loss.  HENT: Negative for congestion, ear discharge, ear pain and sinus pain.   Eyes: Negative for pain, discharge and redness.  Respiratory: Negative for cough, sputum production, shortness of  breath and wheezing.   Cardiovascular: Negative.  Negative for chest pain and palpitations.  Gastrointestinal: Negative for abdominal pain, constipation, diarrhea, heartburn, nausea and vomiting.  Skin: Negative.  Negative for itching and rash.  Neurological: Negative for dizziness and headaches.  Endo/Heme/Allergies: Negative for environmental allergies. Does not bruise/bleed easily.       Objective:   Blood pressure 120/76, pulse 94, temperature 98.4 F (36.9 C), temperature source Temporal, resp. rate 16, height 6' (1.829 m), weight 229 lb 3.2 oz (104 kg), SpO2 99 %. Body mass index is 31.09 kg/m.   Physical Exam:  Physical Exam Constitutional:      Appearance: He is well-developed.  HENT:     Head: Normocephalic and atraumatic.     Right Ear: Tympanic membrane, ear canal and external ear normal.     Left Ear: Tympanic membrane and ear canal normal.     Nose: No nasal deformity, septal deviation, mucosal edema, rhinorrhea or epistaxis.     Right Sinus: No maxillary sinus tenderness or frontal sinus tenderness.     Left Sinus: No maxillary sinus tenderness or frontal sinus tenderness.     Mouth/Throat:     Mouth: Oropharynx is clear and moist. Mucous membranes are not pale and not dry.     Pharynx: Uvula midline.  Eyes:     General:        Right eye: No discharge.        Left eye: No discharge.     Extraocular Movements: EOM normal.     Conjunctiva/sclera: Conjunctivae normal.     Right eye: Right conjunctiva is not injected. No chemosis.    Left eye: Left conjunctiva is not injected. No chemosis.    Pupils: Pupils are equal, round, and reactive to light.  Cardiovascular:     Rate and Rhythm: Normal rate and regular rhythm.     Heart sounds: Normal heart sounds.  Pulmonary:     Effort: Pulmonary effort is normal. No tachypnea, accessory muscle usage or respiratory distress.     Breath sounds: Normal breath sounds. No wheezing, rhonchi or rales.     Comments: Moving  air well in all lung fields.  No increased work of breathing. Chest:     Chest wall: No tenderness.  Lymphadenopathy:     Cervical: No cervical adenopathy.  Skin:    General: Skin is warm.     Capillary Refill: Capillary refill takes less than 2 seconds.     Coloration: Skin is not pale.     Findings: No abrasion, erythema, petechiae or rash. Rash is not papular, urticarial or vesicular.     Comments: No eczematous or urticarial lesions noted.  Neurological:     Mental Status: He is alert.  Psychiatric:        Mood and Affect: Mood and affect normal.      Diagnostic studies:    Spirometry: results normal (FEV1: 3.50/103%, FVC: 4.28/98%, FEV1/FVC: 82%).    Spirometry consistent with normal pattern.   Allergy Studies: none  Salvatore Marvel, MD  [facility]

## 2020-09-07 NOTE — Patient Instructions (Signed)
1. Mild persistent asthma, uncomplicated - Lung testing looks fairly decent.  - We are not going to make any medication changes at all.  - Daily controller medication(s): Singulair 10mg  daily - Prior to physical activity: Ventolin 2 puffs 10-15 minutes before physical activity. - Rescue medications: Ventolin 4 puffs every 4-6 hours as needed - Changes during respiratory infections or worsening symptoms: Add on Symbicort 160/4.42mcg two puffs twice daily for TWO WEEKS. - Asthma control goals:  * Full participation in all desired activities (may need albuterol before activity) * Albuterol use two time or less a week on average (not counting use with activity) * Cough interfering with sleep two time or less a month * Oral steroids no more than once a year * No hospitalizations  2. Other allergic rhinitis - Continue with Dymista one spray per nostril daily as needed. - Continue with Singulair (montelukast) 10mg  daily.   3. Gastroesophageal reflux disease - Continue with Nexium daily.  4. No follow-ups on file.    Please inform 4m of any Emergency Department visits, hospitalizations, or changes in symptoms. Call before going to the ED for breathing or allergy symptoms since we might be able to fit you in for a sick visit. Feel free to contact us anytime with any questions, problems, or concerns.  It was a pleasure to meet you today!  Websites that have reliable patient information: 1. American Academy of Asthma, Allergy, and Immunology: www.aaaai.org 2. Food Allergy Research and Education (FARE): foodallergy.org 3. Mothers of Asthmatics: http://www.asthmacommunitynetwork.org 4. American College of Allergy, Asthma, and Immunology: www.acaai.org   COVID-19 Vaccine Information can be found at: [place] For questions related to vaccine distribution or appointments, please email [email or web address] or call  [phone].   We realize that you might be concerned about having an allergic reaction to the COVID19 vaccines. To help with that concern, WE ARE OFFERING THE COVID19 VACCINES IN OUR OFFICE! Ask the front desk for dates!     "Like" [email or web address] on Facebook and Instagram for our latest updates!       Make sure you are registered to vote! If you have moved or changed any of your contact information, you will need to get this updated before voting!  In some cases, you MAY be able to register to vote online: [phone]

## 2020-09-10 ENCOUNTER — Encounter: Payer: Self-pay | Admitting: Allergy & Immunology

## 2020-09-23 ENCOUNTER — Other Ambulatory Visit: Payer: Self-pay | Admitting: Family Medicine

## 2020-09-25 ENCOUNTER — Telehealth: Payer: Self-pay | Admitting: Family Medicine

## 2020-09-25 NOTE — Telephone Encounter (Signed)
Pt request refill for montelukast.

## 2020-10-09 ENCOUNTER — Other Ambulatory Visit: Payer: Self-pay | Admitting: Allergy & Immunology

## 2020-11-02 ENCOUNTER — Other Ambulatory Visit: Payer: Self-pay | Admitting: Family Medicine

## 2020-12-29 ENCOUNTER — Other Ambulatory Visit: Payer: Self-pay | Admitting: Family Medicine

## 2021-01-02 ENCOUNTER — Other Ambulatory Visit: Payer: Self-pay | Admitting: Family Medicine

## 2021-03-12 ENCOUNTER — Other Ambulatory Visit: Payer: Self-pay | Admitting: Family Medicine

## 2021-05-23 ENCOUNTER — Other Ambulatory Visit: Payer: Self-pay | Admitting: Family Medicine

## 2021-06-16 ENCOUNTER — Other Ambulatory Visit: Payer: Self-pay | Admitting: Allergy & Immunology

## 2021-06-17 IMAGING — DX DG CHEST 1V PORT
1 series · 1 of 1 positions shown · non-contrast
Comparison: 09/08/2019

CLINICAL DATA: Cough and P0DXT-8M positivity

EXAM:
PORTABLE CHEST 1 VIEW

[chest ap]
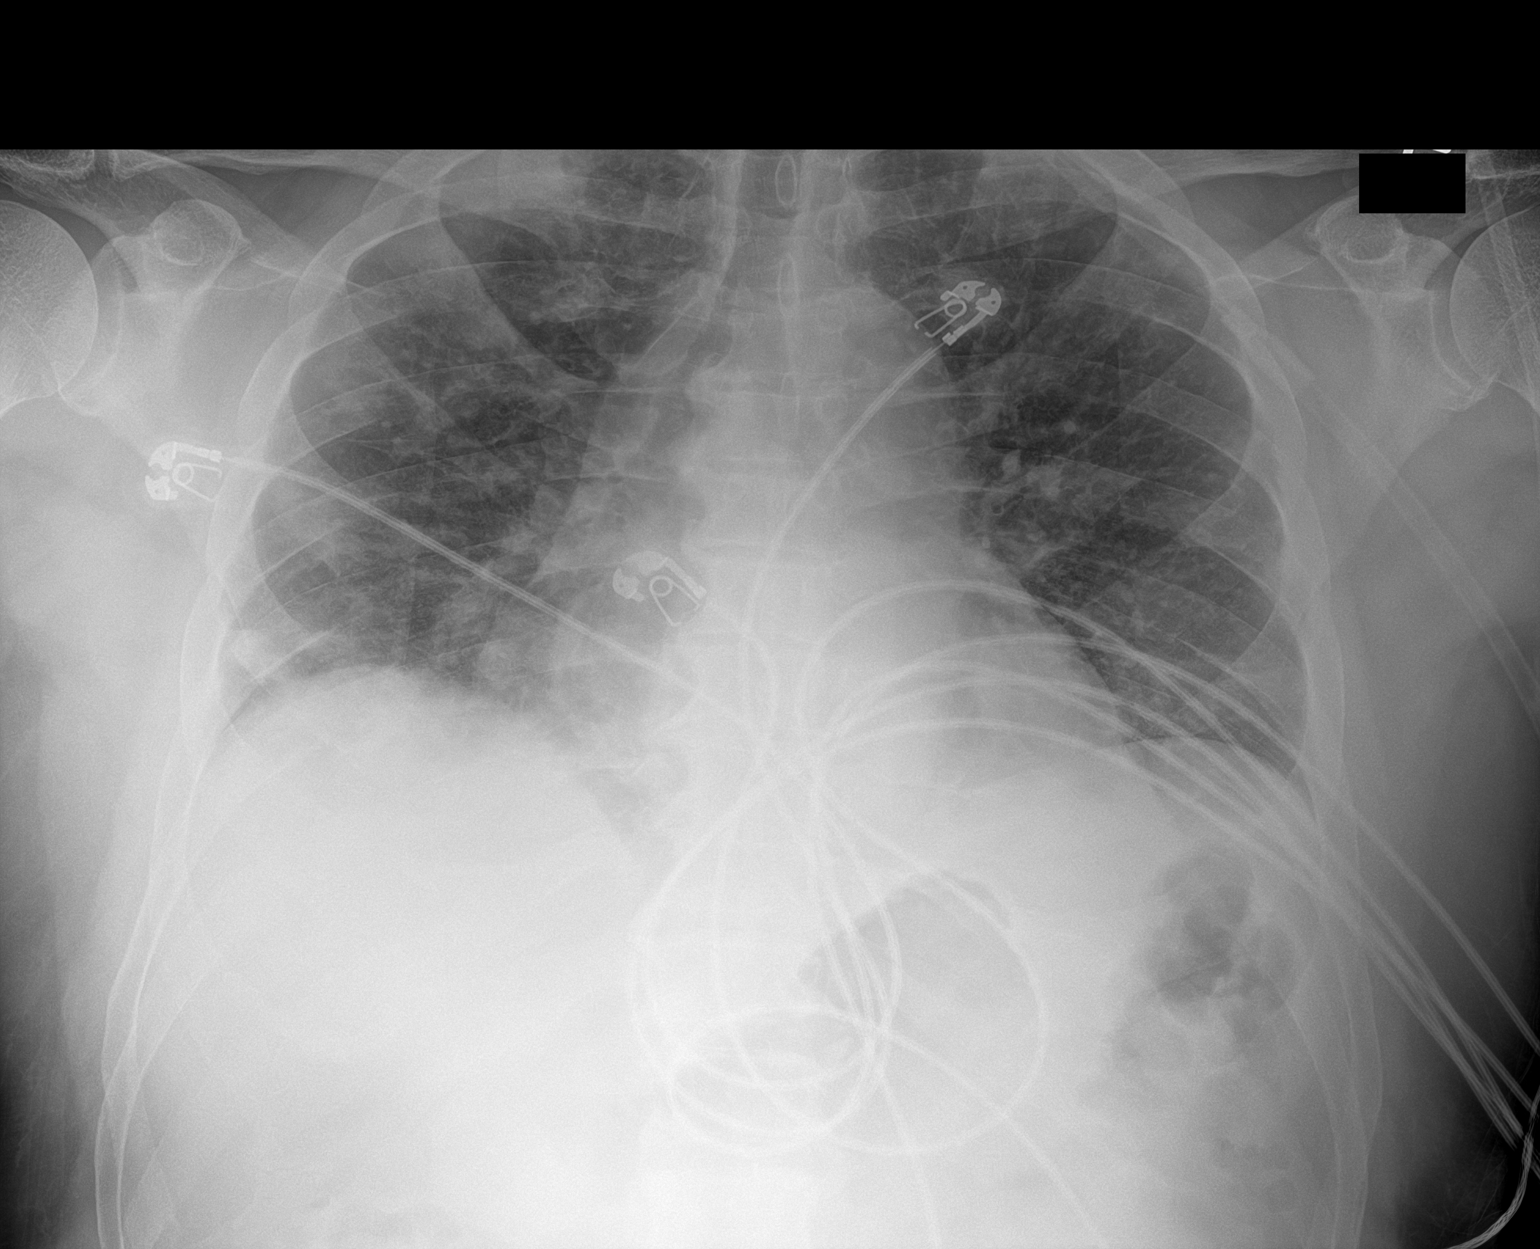

[1 of 1 positions shown; findings below may reference images not displayed]

FINDINGS: Cardiac shadow is stable. Patchy opacities are noted slightly
increased when compared with the prior exam consistent with the
given clinical history of P0DXT-8M positivity. No sizable effusion
is noted. No bony abnormality is seen.
IMPRESSION: Increased parenchymal opacity particularly on the right consistent
with the given clinical history.

## 2021-09-08 ENCOUNTER — Other Ambulatory Visit: Payer: Self-pay | Admitting: Family Medicine

## 2021-11-23 ENCOUNTER — Other Ambulatory Visit: Payer: Self-pay | Admitting: Family Medicine

## 2022-01-07 ENCOUNTER — Other Ambulatory Visit: Payer: Self-pay | Admitting: Family Medicine

## 2022-02-05 ENCOUNTER — Other Ambulatory Visit: Payer: Self-pay | Admitting: Family Medicine

## 2022-03-01 ENCOUNTER — Other Ambulatory Visit: Payer: Self-pay

## 2022-03-04 ENCOUNTER — Encounter: Payer: Self-pay | Admitting: Internal Medicine

## 2022-03-04 ENCOUNTER — Other Ambulatory Visit: Payer: Self-pay

## 2022-03-04 ENCOUNTER — Ambulatory Visit: Payer: 59 | Admitting: Internal Medicine

## 2022-03-04 VITALS — BP 130/80 | HR 89 | Temp 98.2°F | Resp 18 | Ht 74.0 in | Wt 228.5 lb

## 2022-03-04 DIAGNOSIS — J330 Polyp of nasal cavity: Secondary | ICD-10-CM | POA: Diagnosis not present

## 2022-03-04 DIAGNOSIS — J4530 Mild persistent asthma, uncomplicated: Secondary | ICD-10-CM

## 2022-03-04 DIAGNOSIS — J3089 Other allergic rhinitis: Secondary | ICD-10-CM | POA: Diagnosis not present

## 2022-03-04 DIAGNOSIS — K219 Gastro-esophageal reflux disease without esophagitis: Secondary | ICD-10-CM | POA: Diagnosis not present

## 2022-03-04 MED ORDER — BUDESONIDE-FORMOTEROL FUMARATE 80-4.5 MCG/ACT IN AERO: 2.0000 | INHALATION_SPRAY | Freq: Two times a day (BID) | RESPIRATORY_TRACT | 6 refills | Status: DC

## 2022-03-04 MED ORDER — MONTELUKAST SODIUM 10 MG PO TABS: 10.0000 mg | ORAL_TABLET | Freq: Every day | ORAL | 3 refills | Status: DC

## 2022-03-04 NOTE — Progress Notes (Signed)
Follow Up Note  RE: Isaiah Sellers MRN: [id number] DOB: June 04, 1961 Date of Office Visit: 03/04/2022  Referring provider: Andreas Blower., MD Primary care provider: Andreas Blower., MD  Chief Complaint: Asthma  History of Present Illness: I had the pleasure of seeing Isaiah Sellers for a follow up visit at the [facility] on 03/04/2022. He is a 61 y.o. male, who is being followed for asthma, allergic rhinitis, GERD. His previous allergy office visit was on 09/07/2020 with Dr. Dellis Anes. Today is a regular follow up visit.  History obtained from patient, chart review.  ASTHMA - Medical therapy: Previously on Singulair and Symbicort as needed during respiratory flares., ran out a few months ago - Rescue inhaler use: increased use over the past few weeks after sleeping under a fan, currently using albuterol 1-2 times a day - Symptoms: Chest tightness, cough - Exacerbation history: 0 ABX for respiratory illness since last visit, 0 OCS, 0ED, 0 UC visits in the past year  - ACT: 18//25 - Adverse effects of medication: denies  - Previous FEV1: 3.50 L, 103% - Biologic Labs not indicated  Allergic Rhinitis: current therapy: Flonase as needed, Lloyd Huger med sinus rinses,  Previously on astelin as needed and singulair  symptoms improved symptoms include: nasal congestion, rhinorrhea, and post nasal drainage usually flares with weather changes  Previous allergy testing: yes History of reflux/heartburn: yes on Nexium as needed, feels like symptoms are well-controlled Interested in Allergy Immunotherapy: no He has a history of nasal polyps and had a fess with polypectomy in 2020.  He has follow-up planned with Dr. Suszanne Conners in a few weeks to evaluate for recurrence of nasal polyposis.  He reports sense of smell has slightly declined   Assessment and Plan: Isaiah Sellers is a 61 y.o. male with: Mild persistent asthma without complication  Other allergic rhinitis  Polyp of nasal  cavity  Gastroesophageal reflux disease without esophagitis Plan: Patient Instructions  1. Mild persistent asthma, not well controlled  Based on symptoms we will step up care Will get spirometry at next visit - Daily controller medication(s): Singulair 10mg  daily and Symbicort 80/4.62mcg two puffs twice daily with spacer - Prior to physical activity: Ventolin 2 puffs 10-15 minutes before physical activity. - Rescue medications: Ventolin 4 puffs every 4-6 hours as needed - Changes during respiratory infections or worsening symptoms: Add on Symbicort 160/4.51mcg two puffs twice daily for TWO WEEKS. - Asthma control goals:  * Full participation in all desired activities (may need albuterol before activity) * Albuterol use two time or less a week on average (not counting use with activity) * Cough interfering with sleep two time or less a month * Oral steroids no more than once a year * No hospitalizations  2. Other allergic rhinitis -Restart Flonase 1 spray per nostril daily,  -Restart Astelin 1 spray per nostril daily -Restart with Singulair (montelukast) 10mg  daily.  -Continue sinus rinses daily -Follow-up with Dr. 4m, please let know if nasal polyps have returned as we do have new treatment options such as dupilumab  3. Gastroesophageal reflux disease - Continue with Nexium daily as needed   Follow up: 2 months   Thank you so much for letting me partake in your care today.  Don't hesitate to reach out if you have any additional concerns!  Suszanne Conners, MD  [facility] [place] [place]    Please inform us of any Emergency Department visits, hospitalizations, or changes in symptoms. Call  us before going to the ED for breathing or allergy symptoms since we might be able to fit you in for a sick visit. Feel free to contact us anytime with any questions, problems, or concerns.  It was a pleasure to meet you today!  Websites that have reliable patient  information: 1. American Academy of Asthma, Allergy, and Immunology: www.aaaai.org 2. Food Allergy Research and Education (FARE): foodallergy.org 3. Mothers of Asthmatics: http://www.asthmacommunitynetwork.org 4. American College of Allergy, Asthma, and Immunology: www.acaai.org        No follow-ups on file.  No orders of the defined types were placed in this encounter.   Lab Orders  No laboratory test(s) ordered today   Diagnostics: None Done    Medication List:  Current Outpatient Medications  Medication Sig Dispense Refill   acetaminophen (TYLENOL) 325 MG tablet Take 2 tablets (650 mg total) by mouth every 6 (six) hours as needed for mild pain or headache (fever >/= 101).     albuterol (VENTOLIN HFA) 108 (90 Base) MCG/ACT inhaler INHALE 2 PUFFS INTO THE LUNGS EVERY 4 HOURS AS NEEDED FOR WHEEZING OR SHORTNESS OF BREATH 8.5 g 1   amLODipine (NORVASC) 10 MG tablet Take 10 mg by mouth daily.   3   atorvastatin (LIPITOR) 20 MG tablet Take by mouth.     azelastine (ASTELIN) 0.1 % nasal spray USE 1 TO 2 SPRAYS IN EACH NOSTRIL TWICE DAILY AS NEEDED 30 mL 5   cetirizine (ZYRTEC) 10 MG tablet TAKE 1 TABLET BY MOUTH EVERY DAY AS NEEDED FOR ALLERGIES 30 tablet 5   clindamycin (CLEOCIN T) 1 % lotion Apply 1 application topically as needed (for skin (acne)).   5   esomeprazole (NEXIUM) 20 MG packet Take 20 mg by mouth daily before breakfast. 30 each 5   fluticasone (FLONASE) 50 MCG/ACT nasal spray SHAKE LIQUID AND USE 2 SPRAYS IN EACH NOSTRIL DAILY 16 g 0   montelukast (SINGULAIR) 10 MG tablet TAKE 1 TABLET BY MOUTH 1 TIME AT NIGHT FOR COUGHING OR WHEEZING 34 tablet 3   Spacer/Aero-Holding Chambers DEVI Use as directed with inhaler 1 each 0   budesonide-formoterol (SYMBICORT) 160-4.5 MCG/ACT inhaler Inhale 2 puffs into the lungs 2 (two) times daily. Use with spacer.Rinse, gargle and spit out after use (Patient not taking: Reported on 03/04/2022) 1 Inhaler 5   No current  facility-administered medications for this visit.   Allergies: Allergies  Allergen Reactions   Chocolate Rash   Other Rash    All carbonated drinks (soda)   I reviewed his past medical history, social history, family history, and environmental history and no significant changes have been reported from his previous visit.  ROS: All others negative except as noted per HPI.   Objective: BP 130/80   Pulse 89   Temp 98.2 F (36.8 C) (Temporal)   Resp 18   Ht 6\' 2"  (1.88 m)   Wt 228 lb 8 oz (103.6 kg)   SpO2 97%   BMI 29.34 kg/m  Body mass index is 29.34 kg/m. General Appearance:  Alert, cooperative, no distress, appears stated age  Head:  Normocephalic, without obvious abnormality, atraumatic  Eyes:  Conjunctiva clear, EOM's intact  Nose: Nares normal,  yellow rhinorrhea, hypertrophic turbinates, no visible anterior polyps, and septum midline  Throat: Lips, tongue normal; teeth and gums normal, + cobblestoning  Neck: Supple, symmetrical  Lungs:   clear to auscultation bilaterally, Respirations unlabored, no coughing  Heart:  regular rate and rhythm and no murmur, Appears well  perfused  Extremities: No edema  Skin: Skin color, texture, turgor normal, no rashes or lesions on visualized portions of skin   Neurologic: No gross deficits   Previous notes and tests were reviewed. The plan was reviewed with the patient/family, and all questions/concerned were addressed.  It was my pleasure to see Isaiah Sellers today and participate in his care. Please feel free to contact me with any questions or concerns.  Sincerely,  Roney Marion, MD  Allergy & Immunology  [facility] [place] [facility] Office: [phone]

## 2022-03-04 NOTE — Patient Instructions (Addendum)
1. Mild persistent asthma, not well controlled  Based on symptoms we will step up care Will get spirometry at next visit - Daily controller medication(s): Singulair 10mg  daily and Symbicort 80/4.45mcg two puffs twice daily with spacer - Prior to physical activity: Ventolin 2 puffs 10-15 minutes before physical activity. - Rescue medications: Ventolin 4 puffs every 4-6 hours as needed - Changes during respiratory infections or worsening symptoms: Add on Symbicort 160/4.49mcg two puffs twice daily for TWO WEEKS. - Asthma control goals:  * Full participation in all desired activities (may need albuterol before activity) * Albuterol use two time or less a week on average (not counting use with activity) * Cough interfering with sleep two time or less a month * Oral steroids no more than once a year * No hospitalizations  2. Other allergic rhinitis -Restart Flonase 1 spray per nostril daily,  -Restart Astelin 1 spray per nostril daily -Restart with Singulair (montelukast) 10mg  daily.  -Continue sinus rinses daily -Follow-up with Dr. 4m, please let know if nasal polyps have returned as we do have new treatment options such as dupilumab  3. Gastroesophageal reflux disease - Continue with Nexium daily as needed   Follow up: 2 months   Thank you so much for letting me partake in your care today.  Don't hesitate to reach out if you have any additional concerns!  Suszanne Conners, MD  [facility] [place] [place]    Please inform us of any Emergency Department visits, hospitalizations, or changes in symptoms. Call [name] before going to the ED for breathing or allergy symptoms since we might be able to fit you in for a sick visit. Feel free to contact us anytime with any questions, problems, or concerns.  It was a pleasure to meet you today!  Websites that have reliable patient information: 1. American Academy of Asthma, Allergy, and Immunology: www.aaaai.org 2. Food Allergy  Research and Education (FARE): foodallergy.org 3. Mothers of Asthmatics: http://www.asthmacommunitynetwork.org 4. American College of Allergy, Asthma, and Immunology: www.acaai.org

## 2022-03-06 ENCOUNTER — Other Ambulatory Visit: Payer: Self-pay

## 2022-03-06 MED ORDER — AZELASTINE HCL 0.1 % NA SOLN: NASAL | 5 refills | Status: DC

## 2022-04-30 ENCOUNTER — Ambulatory Visit: Payer: 59 | Admitting: Internal Medicine

## 2022-04-30 ENCOUNTER — Encounter: Payer: Self-pay | Admitting: Internal Medicine

## 2022-04-30 VITALS — BP 124/84 | HR 90 | Temp 98.1°F | Resp 20

## 2022-04-30 DIAGNOSIS — J3089 Other allergic rhinitis: Secondary | ICD-10-CM

## 2022-04-30 DIAGNOSIS — J4540 Moderate persistent asthma, uncomplicated: Secondary | ICD-10-CM | POA: Diagnosis not present

## 2022-04-30 DIAGNOSIS — J302 Other seasonal allergic rhinitis: Secondary | ICD-10-CM

## 2022-04-30 DIAGNOSIS — J330 Polyp of nasal cavity: Secondary | ICD-10-CM | POA: Diagnosis not present

## 2022-04-30 DIAGNOSIS — K219 Gastro-esophageal reflux disease without esophagitis: Secondary | ICD-10-CM

## 2022-04-30 DIAGNOSIS — J4530 Mild persistent asthma, uncomplicated: Secondary | ICD-10-CM

## 2022-04-30 NOTE — Patient Instructions (Addendum)
1. Mild persistent asthma, not well controlled  Breathing tests: looked like your have worsening inflammation in your lungs  -For 1 week increase Symbicort to 4 puffs twice daily with spacer -If you are better after a week reduce Symbicort to 2 puffs twice daily with spacer and continue this dose until follow-up -If you are getting worse after a week please give [place] a call and we will consider prednisone then  - Daily controller medication(s): Singulair 10mg  daily and Symbicort 80/4.78mcg two puffs twice daily with spacer - Prior to physical activity: Ventolin 2 puffs 10-15 minutes before physical activity. - Rescue medications: Ventolin 4 puffs every 4-6 hours as needed - Asthma control goals:  * Full participation in all desired activities (may need albuterol before activity) * Albuterol use two time or less a week on average (not counting use with activity) * Cough interfering with sleep two time or less a month * Oral steroids no more than once a year * No hospitalizations  2. Other allergic rhinitis -Hold Flonase for 1 week for nosebleeds, then restart at 1 spray per nostril daily, do not go above this dose -Continue Astelin 1 spray per nostril twice daily  -Continue with Singulair (montelukast) 10mg  daily.  -Continue sinus rinses daily - Continue to Follow-up with Dr. Benjamine Mola, please let us know if nasal polyps have returned as we do have new treatment options such as dupilumab  3. Gastroesophageal reflux disease - Continue with Nexium daily as needed   Follow up: 6 weeks   Thank you so much for letting me partake in your care today.  Don't hesitate to reach out if you have any additional concerns!  Roney Marion, MD  [facility] [place], [place]    Please inform us of any Emergency Department visits, hospitalizations, or changes in symptoms. Call us before going to the ED for breathing or allergy symptoms since we might be able to fit you in for a sick visit. Feel  free to contact us anytime with any questions, problems, or concerns.  It was a pleasure to see you again today!  Websites that have reliable patient information: 1. American Academy of Asthma, Allergy, and Immunology: www.aaaai.org 2. Food Allergy Research and Education (FARE): foodallergy.org 3. Mothers of Asthmatics: http://www.asthmacommunitynetwork.org 4. American College of Allergy, Asthma, and Immunology: www.acaai.org

## 2022-04-30 NOTE — Progress Notes (Signed)
Follow Up Note  RE: Isaiah Sellers MRN: [id number] DOB: [birth year] Date of Office Visit: 04/30/2022  Referring provider: Andreas Blower., MD Primary care provider: Andreas Blower., MD  Chief Complaint: Asthma  History of Present Illness: I had the pleasure of seeing Isaiah Sellers for a follow up visit at the [facility] on 04/30/2022. He is a 61 y.o. male, who is being followed for persistent asthma, allergic rhinitis, . His previous allergy office visit was on 03/04/22 with Dr. [name]. Today is a regular follow up visit.  History obtained from patient, chart review .  ASTHMA - Medical therapy: Symbicort 2 puffs daily (recently increased to twice a day over the weekend due to increased symptoms per PCP)  - Rescue inhaler use: a few times a day, since uppping symbicort he has only needed it daily  - Symptoms: cough, wheezing, denies dyspnea  - Exacerbation history: 0 ABX for respiratory illness since last visit, 0 OCS, 0ED, 0 UC visits in the past year  - ACT: 19 /25 - Adverse effects of medication: denies  - Previous FEV1: 3.50 L, 103% - Biologic Labs not done    Allergic Rhinitis: current therapy: Flonase 1 spray per nostril twice a day, Astelin 1 spray per nostril twice a day, Singulair 10 mg daily, Neil med sinus rinses,   symptoms improved initially however now with scant epistaxis when he blows his nose, he was started on medication for his skin which is drying out "everything" which he thinks is contributing to epistaxis Previous allergy testing: yes Interested in Allergy Immunotherapy: no He has a history of nasal polyps and had a fess with polypectomy in 2020.  He had follow-up with Dr. Suszanne Conners in August he reported no regrowth of his nasal polyps   GERD Well-controlled on Nexium no breakthrough symptoms  Assessment and Plan: Isaiah Sellers is a 61 y.o. male with: Mild persistent asthma without complication - Plan: Spirometry with Graph  Seasonal  and perennial allergic rhinitis  Polyp of nasal cavity  Gastroesophageal reflux disease without esophagitis Plan: Patient Instructions  1. Mild persistent asthma, not well controlled  Breathing tests: looked like your have worsening inflammation in your lungs  -For 1 week increase Symbicort to 4 puffs twice daily with spacer -If you are better after a week reduce Symbicort to 2 puffs twice daily with spacer and continue this dose until follow-up -If you are getting worse after a week please give [place] a call and we will consider prednisone then  - Daily controller medication(s): Singulair 10mg  daily and Symbicort 80/4.75mcg two puffs twice daily with spacer - Prior to physical activity: Ventolin 2 puffs 10-15 minutes before physical activity. - Rescue medications: Ventolin 4 puffs every 4-6 hours as needed - Asthma control goals:  * Full participation in all desired activities (may need albuterol before activity) * Albuterol use two time or less a week on average (not counting use with activity) * Cough interfering with sleep two time or less a month * Oral steroids no more than once a year * No hospitalizations  2. Other allergic rhinitis -Hold Flonase for 1 week for nosebleeds, then restart at 1 spray per nostril daily, do not go above this dose -Continue Astelin 1 spray per nostril twice daily  -Continue with Singulair (montelukast) 10mg  daily.  -Continue sinus rinses daily - Continue to Follow-up with Dr. 4m, please let know if nasal polyps have returned as we do have new treatment options  such as dupilumab  3. Gastroesophageal reflux disease - Continue with Nexium daily as needed   Follow up: 6 weeks   Thank you so much for letting me partake in your care today.  Don't hesitate to reach out if you have any additional concerns!  Roney Marion, MD  [facility] [place] [place]    Please inform us of any Emergency Department visits, hospitalizations, or  changes in symptoms. Call us before going to the ED for breathing or allergy symptoms since we might be able to fit you in for a sick visit. Feel free to contact us anytime with any questions, problems, or concerns.  It was a pleasure to see you again today!  Websites that have reliable patient information: 1. American Academy of Asthma, Allergy, and Immunology: www.aaaai.org 2. Food Allergy Research and Education (FARE): foodallergy.org 3. Mothers of Asthmatics: http://www.asthmacommunitynetwork.org 4. American College of Allergy, Asthma, and Immunology: www.acaai.org       No follow-ups on file.  No orders of the defined types were placed in this encounter.   Lab Orders  No laboratory test(s) ordered today   Diagnostics: Spirometry:  Tracings reviewed. His effort: Good reproducible efforts. FVC: 3.19 L FEV1: 3.05 L, 97% predicted FEV1/FVC ratio: 96% Interpretation: Spirometry consistent with normal pattern.  Please see scanned spirometry results for details.  Results interpreted by myself during this encounter and discussed with patient/family.   Medication List:  Current Outpatient Medications  Medication Sig Dispense Refill   acetaminophen (TYLENOL) 325 MG tablet Take 2 tablets (650 mg total) by mouth every 6 (six) hours as needed for mild pain or headache (fever >/= 101).     albuterol (VENTOLIN HFA) 108 (90 Base) MCG/ACT inhaler INHALE 2 PUFFS INTO THE LUNGS EVERY 4 HOURS AS NEEDED FOR WHEEZING OR SHORTNESS OF BREATH 8.5 g 1   amLODipine (NORVASC) 10 MG tablet Take 10 mg by mouth daily.   3   atorvastatin (LIPITOR) 20 MG tablet Take by mouth.     azelastine (ASTELIN) 0.1 % nasal spray USE 1 TO 2 SPRAYS IN EACH NOSTRIL TWICE DAILY AS NEEDED 30 mL 5   budesonide-formoterol (SYMBICORT) 80-4.5 MCG/ACT inhaler Inhale 2 puffs into the lungs 2 (two) times daily. 1 each 6   cetirizine (ZYRTEC) 10 MG tablet TAKE 1 TABLET BY MOUTH EVERY DAY AS NEEDED FOR ALLERGIES 30 tablet 5    CLARAVIS 30 MG capsule Take 60 mg by mouth daily.     clindamycin (CLEOCIN T) 1 % lotion Apply 1 application topically as needed (for skin (acne)).   5   esomeprazole (NEXIUM) 20 MG packet Take 20 mg by mouth daily before breakfast. 30 each 5   fluticasone (FLONASE) 50 MCG/ACT nasal spray SHAKE LIQUID AND USE 2 SPRAYS IN EACH NOSTRIL DAILY 16 g 0   losartan (COZAAR) 50 MG tablet Take 50 mg by mouth daily.     montelukast (SINGULAIR) 10 MG tablet Take 1 tablet (10 mg total) by mouth at bedtime. 30 tablet 3   Spacer/Aero-Holding [facility] Use as directed with inhaler 1 each 0   No current facility-administered medications for this visit.   Allergies: Allergies  Allergen Reactions   Chocolate Rash   Other Rash    All carbonated drinks (soda)   I reviewed his past medical history, social history, family history, and environmental history and no significant changes have been reported from his previous visit.  ROS: All others negative except as noted per HPI.   Objective:  BP 124/84 (BP Location: Left Arm, Patient Position: Sitting, Cuff Size: Large)   Pulse 90   Temp 98.1 F (36.7 C) (Temporal)   Resp 20   SpO2 99%  There is no height or weight on file to calculate BMI. General Appearance:  Alert, cooperative, no distress, appears stated age  Head:  Normocephalic, without obvious abnormality, atraumatic  Eyes:  Conjunctiva clear, EOM's intact  Nose: Nares normal,  erythematous nasal mucosa, blood in right nares, hypertrophic turbinates, no visible anterior polyps, and septum midline  Throat: Lips, tongue normal; teeth and gums normal, no tonsillar exudate and + cobblestoning  Neck: Supple, symmetrical  Lungs:   clear to auscultation bilaterally, Respirations unlabored, no coughing  Heart:  regular rate and rhythm and no murmur, Appears well perfused  Extremities: No edema  Skin: Skin color, texture, turgor normal, no rashes or lesions on visualized portions of skin    Neurologic: No gross deficits   Previous notes and tests were reviewed. The plan was reviewed with the patient/family, and all questions/concerned were addressed.  It was my pleasure to see Isaiah Sellers today and participate in his care. Please feel free to contact me with any questions or concerns.  Sincerely,  [name], MD  Allergy & Immunology  [facility] Office: [phone]

## 2022-05-08 ENCOUNTER — Telehealth: Payer: Self-pay

## 2022-05-08 NOTE — Telephone Encounter (Signed)
Prednisone is unlikely to help with nose bleeds.  I recommend OTC afrin or oxymetalozine nasal spray 1 spray in each nostril up to three times a day to control nose bleeds.

## 2022-05-08 NOTE — Telephone Encounter (Signed)
Pt called and said he is feeling somewhat improved but still blowing blood out of his nose. He is doing the azelastine and stopped the flonase and doing nasal saline. Wants prednisone to help the bleeding to stop.

## 2022-05-08 NOTE — Telephone Encounter (Signed)
Pt informed and stated understanding

## 2022-05-13 ENCOUNTER — Telehealth: Payer: Self-pay | Admitting: Internal Medicine

## 2022-05-13 MED ORDER — PREDNISONE 10 MG PO TABS
ORAL_TABLET | ORAL | 0 refills | Status: DC
Start: 2022-05-13 — End: 2022-09-11

## 2022-05-13 NOTE — Telephone Encounter (Signed)
Pt called and stated he is having sinus problems due to the weather change and says that he gets like this every year at this time and having a lot of nasal congestion and drainage that is blood tinged and not nose bleeds. He has sinus pressure and pain. Per Dr. Edison Pace we will send rx for prednisone day pack. Pt informed. He will call us back if not better.

## 2022-06-02 ENCOUNTER — Other Ambulatory Visit: Payer: Self-pay | Admitting: Family Medicine

## 2022-06-11 ENCOUNTER — Ambulatory Visit: Payer: 59 | Admitting: Internal Medicine

## 2022-06-11 ENCOUNTER — Encounter: Payer: Self-pay | Admitting: Internal Medicine

## 2022-06-11 VITALS — BP 138/84 | HR 101 | Temp 98.9°F | Resp 19 | Ht 73.0 in | Wt 233.8 lb

## 2022-06-11 DIAGNOSIS — J330 Polyp of nasal cavity: Secondary | ICD-10-CM

## 2022-06-11 DIAGNOSIS — K219 Gastro-esophageal reflux disease without esophagitis: Secondary | ICD-10-CM

## 2022-06-11 DIAGNOSIS — J4530 Mild persistent asthma, uncomplicated: Secondary | ICD-10-CM

## 2022-06-11 DIAGNOSIS — J3089 Other allergic rhinitis: Secondary | ICD-10-CM

## 2022-06-11 DIAGNOSIS — J302 Other seasonal allergic rhinitis: Secondary | ICD-10-CM

## 2022-06-11 MED ORDER — AMOXICILLIN-POT CLAVULANATE 875-125 MG PO TABS: 1.0000 | ORAL_TABLET | Freq: Two times a day (BID) | ORAL | 0 refills | Status: DC

## 2022-06-11 MED ORDER — XHANCE 93 MCG/ACT NA EXHU: 1.0000 | INHALANT_SUSPENSION | Freq: Two times a day (BID) | NASAL | 6 refills | Status: AC

## 2022-06-11 NOTE — Patient Instructions (Addendum)
1. Mild persistent asthma, Breathing tests showed: looked much better!   - Daily controller medication(s): Singulair 10mg  daily and Symbicort 80/4.41mcg two puffs twice daily with spacer - Prior to physical activity: Ventolin 2 puffs 10-15 minutes before physical activity. - Rescue medications: Ventolin 4 puffs every 4-6 hours as needed - Asthma control goals:  * Full participation in all desired activities (may need albuterol before activity) * Albuterol use two time or less a week on average (not counting use with activity) * Cough interfering with sleep two time or less a month * Oral steroids no more than once a year * No hospitalizations  2. 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  -Continue sinus rinses daily before nasal sprays  - Continue to Follow-up with Dr. 4m, please let know if nasal polyps have returned as we do have new treatment options such as dupilumab  3. Gastroesophageal reflux disease - Continue with Nexium daily as needed   Follow up: 3 months   Thank you so much for letting me partake in your care today.  Don't hesitate to reach out if you have any additional concerns!  Suszanne Conners, MD  [facility] [place] [place]

## 2022-06-11 NOTE — Progress Notes (Signed)
Follow Up Note  RE: Isaiah Sellers MRN: [id number] DOB: 11-02-1960 Date of Office Visit: 06/11/2022  Referring provider: Andreas Blower., MD Primary care provider: Andreas Blower., MD  Chief Complaint: Follow-up (Pt states he's still congested and stuffy.), Asthma, Allergic Rhinitis , and Nasal Congestion  History of Present Illness: I had the pleasure of seeing Isaiah Sellers for a follow up visit at the [facility] on 06/11/2022. He is a 61 y.o. male, who is being followed for persistent asthma, allergic rhinitis, nasal polyposis, GERD. His previous allergy office visit was on 04/30/2022 with Dr. [name]. Today is a regular follow up visit.  History obtained from patient, chart review.  Asthma -At last visit he reported increased cough, wheeze.  His spirometry did show a slight decline in FEV1 from previous.  He was treated for acute exacerbation and recommend him to increase his Symbicort to 4 puffs twice daily.  He called the clinic a few days later due to worsening symptoms and a prednisone course was prescribed. -He reports resolution of cough and wheezing.  Allergic rhinosinusitis with nasal polyposis -Continues to have copious thick green rhinorrhea and drainage which is worse in the mornings.  He is starting to have changes in his sense of taste but denies any changes in his sense of smell. -Flonase discontinued last visit due to epistaxis.  The Astelin with good response to nasal pruritus. -He has not tried [place] yet. -Follows with Dr. Suszanne Conners twice a year and had no regrowth of his nasal polyps after polypectomy and fess in 2020 and is August  GERD -Well-controlled on Nexium no breakthrough symptoms  Assessment and Plan: Isaiah Sellers is a 61 y.o. male with: Mild persistent asthma without complication - Plan: Spirometry with Graph  Seasonal and perennial allergic rhinitis  Gastroesophageal reflux disease without esophagitis  Polyp of nasal  cavity Plan: Patient Instructions  1. Mild persistent asthma, Breathing tests showed: looked much better!   - Daily controller medication(s): Singulair 10mg  daily and Symbicort 80/4.11mcg two puffs twice daily with spacer - Prior to physical activity: Ventolin 2 puffs 10-15 minutes before physical activity. - Rescue medications: Ventolin 4 puffs every 4-6 hours as needed - Asthma control goals:  * Full participation in all desired activities (may need albuterol before activity) * Albuterol use two time or less a week on average (not counting use with activity) * Cough interfering with sleep two time or less a month * Oral steroids no more than once a year * No hospitalizations  2. 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  -Continue sinus rinses daily before nasal sprays  - Continue to Follow-up with Dr. 4m, please let know if nasal polyps have returned as we do have new treatment options such as dupilumab  3. Gastroesophageal reflux disease - Continue with Nexium daily as needed   Follow up: 3 months   Thank you so much for letting me partake in your care today.  Don't hesitate to reach out if you have any additional concerns!  Suszanne Conners, MD  [facility] [place]  No follow-ups on file.  Meds ordered this encounter  Medications   amoxicillin-clavulanate (AUGMENTIN) 875-125 MG  tablet    Sig: Take 1 tablet by mouth 2 (two) times daily.    Dispense:  10 tablet    Refill:  0   Fluticasone Propionate (XHANCE) 93 MCG/ACT EXHU    Sig: Place 1 spray into the nose in the morning and at bedtime.    Dispense:  16 mL    Refill:  6    Lab Orders  No laboratory test(s) ordered  today   Diagnostics: Spirometry:  Tracings reviewed. His effort: Good reproducible efforts. FVC: 4.51 L FEV1: 3.44 L, 108% predicted FEV1/FVC ratio: 76% Interpretation: Spirometry consistent with normal pattern.  Please see scanned spirometry results for details.   Results interpreted by myself during this encounter and discussed with patient/family.   Medication List:  Current Outpatient Medications  Medication Sig Dispense Refill   acetaminophen (TYLENOL) 325 MG tablet Take 2 tablets (650 mg total) by mouth every 6 (six) hours as needed for mild pain or headache (fever >/= 101).     albuterol (VENTOLIN HFA) 108 (90 Base) MCG/ACT inhaler INHALE 2 PUFFS INTO THE LUNGS EVERY 4 HOURS AS NEEDED FOR WHEEZING OR SHORTNESS OF BREATH 8.5 g 1   amLODipine (NORVASC) 10 MG tablet Take 10 mg by mouth daily.   3   amoxicillin-clavulanate (AUGMENTIN) 875-125 MG tablet Take 1 tablet by mouth 2 (two) times daily. 10 tablet 0   atorvastatin (LIPITOR) 20 MG tablet Take by mouth.     azelastine (ASTELIN) 0.1 % nasal spray USE 1 TO 2 SPRAYS IN EACH NOSTRIL TWICE DAILY AS NEEDED 30 mL 5   budesonide-formoterol (SYMBICORT) 80-4.5 MCG/ACT inhaler Inhale 2 puffs into the lungs 2 (two) times daily. 1 each 6   cetirizine (ZYRTEC) 10 MG tablet TAKE 1 TABLET BY MOUTH EVERY DAY AS NEEDED FOR ALLERGIES 30 tablet 0   CLARAVIS 30 MG capsule Take 60 mg by mouth daily.     clindamycin (CLEOCIN T) 1 % lotion Apply 1 application topically as needed (for skin (acne)).   5   esomeprazole (NEXIUM) 20 MG packet Take 20 mg by mouth daily before breakfast. 30 each 5   Fluticasone Propionate (XHANCE) 93 MCG/ACT EXHU Place 1 spray into the nose in the morning and at bedtime. 16 mL 6   losartan (COZAAR) 50 MG tablet Take 50 mg by mouth daily.     montelukast (SINGULAIR) 10 MG tablet Take 1 tablet (10 mg total) by mouth at bedtime. 30 tablet 3   Spacer/Aero-Holding [facility] Use as directed with inhaler 1 each 0    predniSONE (DELTASONE) 10 MG tablet Take 2 tablet twice daily for 3 days, then 2 tablets on the fourth day, then 1 tablet on day 5. (Patient not taking: Reported on 06/11/2022) 15 tablet 0   No current facility-administered medications for this visit.   Allergies: Allergies  Allergen Reactions   Chocolate Rash   Other Rash    All carbonated drinks (soda)   I reviewed his past medical history, social history, family history, and environmental history and no significant changes have been reported from his previous visit.  ROS: All others negative except as noted per HPI.   Objective: BP 138/84   Pulse (!) 101   Temp 98.9 F (37.2 C) (Temporal)   Resp 19   Ht 6\' 1"  (1.854 m)   Wt 233 lb 12.8 oz (106.1 kg)   SpO2 99%   BMI 30.85 kg/m  Body mass index is 30.85 kg/m. General Appearance:  Alert, cooperative, no distress,  appears stated age  Head:  Normocephalic, without obvious abnormality, atraumatic  Eyes:  Conjunctiva clear, EOM's intact  Nose: Nares normal,  erythematous nasal mucosa, edematous nasal mucosa, thick yellow to green rhinorrhea, hypertrophic turbinates, no visible anterior polyps, and septum midline  Throat: Lips, tongue normal; teeth and gums normal, + cobblestoning  Neck: Supple, symmetrical  Lungs:   clear to auscultation bilaterally, Respirations unlabored, intermittent dry coughing  Heart:  regular rate and rhythm and no murmur, Appears well perfused  Extremities: No edema  Skin: Skin color, texture, turgor normal, no rashes or lesions on visualized portions of skin   Neurologic: No gross deficits   Previous notes and tests were reviewed. The plan was reviewed with the patient/family, and all questions/concerned were addressed.  It was my pleasure to see Isaiah Sellers today and participate in his care. Please feel free to contact me with any questions or concerns.  Sincerely,  [name], MD  Allergy & Immunology  [facility] Office: [phone]

## 2022-06-19 MED ORDER — AMOXICILLIN-POT CLAVULANATE 875-125 MG PO TABS: 1.0000 | ORAL_TABLET | Freq: Two times a day (BID) | ORAL | 0 refills | Status: DC

## 2022-06-19 NOTE — Addendum Note (Signed)
Addended by: Bryson Corona on: 06/19/2022 04:37 PM   Modules accepted: Orders

## 2022-06-26 ENCOUNTER — Telehealth: Payer: Self-pay

## 2022-06-26 NOTE — Telephone Encounter (Signed)
PA for Isaiah Sellers has been DENIED due to :medication and/or diagnosis are not a covered benefit and are excluded from coverage in accordance with the terms and conditions of your plan benefit.

## 2022-06-26 NOTE — Telephone Encounter (Signed)
PA request received via CMM through [employer] for [facility] 93MCG/ACT exhaler suspension.  PA has been submitted and is awaiting determination.   Key: [id number]

## 2022-06-27 MED ORDER — FLUTICASONE PROPIONATE 50 MCG/ACT NA SUSP: 2.0000 | Freq: Every day | NASAL | 5 refills | Status: DC

## 2022-06-27 NOTE — Telephone Encounter (Signed)
Please advise to change in therapy as xhance pa was denied

## 2022-06-27 NOTE — Addendum Note (Signed)
Addended by: Berna Bue on: 06/27/2022 04:12 PM   Modules accepted: Orders

## 2022-06-27 NOTE — Telephone Encounter (Signed)
Please advise xhance pa was denied change in therapy needed

## 2022-06-27 NOTE — Telephone Encounter (Signed)
We can change to flonase 2 sprays per nostril daily.  Thanks!

## 2022-06-27 NOTE — Telephone Encounter (Signed)
Can you please send this to Dr. [name] as she ordered this? Thank you

## 2022-06-27 NOTE — Telephone Encounter (Signed)
Sent in flonase to take place of xhance

## 2022-07-02 ENCOUNTER — Other Ambulatory Visit: Payer: Self-pay | Admitting: Family Medicine

## 2022-09-11 ENCOUNTER — Ambulatory Visit: Payer: 59 | Admitting: Internal Medicine

## 2022-09-11 ENCOUNTER — Encounter: Payer: Self-pay | Admitting: Internal Medicine

## 2022-09-11 VITALS — BP 132/78 | HR 90 | Temp 98.1°F | Resp 20

## 2022-09-11 DIAGNOSIS — K219 Gastro-esophageal reflux disease without esophagitis: Secondary | ICD-10-CM | POA: Diagnosis not present

## 2022-09-11 DIAGNOSIS — J330 Polyp of nasal cavity: Secondary | ICD-10-CM

## 2022-09-11 DIAGNOSIS — J4530 Mild persistent asthma, uncomplicated: Secondary | ICD-10-CM | POA: Diagnosis not present

## 2022-09-11 DIAGNOSIS — J3089 Other allergic rhinitis: Secondary | ICD-10-CM

## 2022-09-11 DIAGNOSIS — J302 Other seasonal allergic rhinitis: Secondary | ICD-10-CM

## 2022-09-11 NOTE — Patient Instructions (Addendum)
1. Mild persistent asthma, controlled   - Daily controller medication(s): Singulair 78m daily and Symbicort 80/4.585m two puffs twice daily with spacer as needed   - for increase in symptoms in URI start symbicort 2 puffs twice daily for 2 weeks  - Prior to physical activity: Ventolin 2 puffs 10-15 minutes before physical activity. - Rescue medications: Ventolin 4 puffs every 4-6 hours as needed - Asthma control goals:  * Full participation in all desired activities (may need albuterol before activity) * Albuterol use two time or less a week on average (not counting use with activity) * Cough interfering with sleep two time or less a month * Oral steroids no more than once a year * No hospitalizations  2. Other allergic rhinitis with nasal polyposis Will continue current plan, if symptoms recur (nasal congestion, sinus congestion, loss of smell) plan to start dupixent  -Continue exhance 1 spray per nostril twice daily, coupon and sample given today  -Continue Astelin 1 spray per nostril twice daily  -Continue with Singulair (montelukast) 1043maily.  -Continue sinus rinses daily before nasal sprays  - Continue to Follow-up with Dr. TeoBenjamine Molalease let us [place] if nasal polyps have returned as we do have new treatment options such as dupilumab  3. Gastroesophageal reflux disease - Continue with Nexium daily as needed   Follow up: 3 months   Thank you so much for letting me partake in your care today.  Don't hesitate to reach out if you have any additional concerns!  EveRoney MarionD  [facility]

## 2022-09-11 NOTE — Progress Notes (Signed)
Follow Up Note  RE: Isaiah Sellers MRN: [id number] DOB: Nov 20, 1960 Date of Office Visit: 09/11/2022  Referring provider: Kristopher Glee., MD Primary care provider: Kristopher Glee., MD  Chief Complaint: Asthma and Allergies  History of Present Illness: I had the pleasure of seeing Isaiah Sellers for a follow up visit at the [facility] on 09/13/2022. He is a 62 y.o. male, who is being followed for persistent asthma, nasal polyposis, allergic rhinitis GERD. His previous allergy office visit was on 06/11/22 with Dr. Edison Pace. Today is a regular follow up visit.  History obtained from patient, chart review.  At last visit he was treated for acute sinus infection with Augmentin and restarted on Xhance.  Today he reports that his upper and lower respiratory symptoms been well-controlled.  He continues to use [place] as needed usually 2-3 times a week.  He has not used his Astelin but is continue the Singulair and sinus rinses.  He is only using his Symbicort as needed approximately 2-3 times a week and feels like his asthma is well-controlled.  Denies any other corticosteroids or antibiotics since last visit.  Main concern is his ENT Dr. Lorelee Cover is now out of network and he would like [place] to take over monitoring for recurrence of his nasal polyposis.  Denies any recurrent nasal congestion, hyposmia, ageusia  GERD is well-controlled with as needed Nexium which require about weekly.  Assessment and Plan: Isaiah Sellers is a 62 y.o. male with: Seasonal and perennial allergic rhinitis  Mild persistent asthma without complication  Gastroesophageal reflux disease without esophagitis  Polyp of nasal cavity  Other allergic rhinitis   Plan: Patient Instructions  1. Mild persistent asthma, controlled   - Daily controller medication(s): Singulair 53m daily and Symbicort 80/4.525m two puffs twice daily with spacer as needed   - for increase in symptoms in URI start symbicort 2 puffs  twice daily for 2 weeks  - Prior to physical activity: Ventolin 2 puffs 10-15 minutes before physical activity. - Rescue medications: Ventolin 4 puffs every 4-6 hours as needed - Asthma control goals:  * Full participation in all desired activities (may need albuterol before activity) * Albuterol use two time or less a week on average (not counting use with activity) * Cough interfering with sleep two time or less a month * Oral steroids no more than once a year * No hospitalizations  2. Other allergic rhinitis with nasal polyposis Will continue current plan, if symptoms recur (nasal congestion, sinus congestion, loss of smell) plan to start dupixent  -Continue exhance 1 spray per nostril twice daily, coupon and sample given today  -Continue Astelin 1 spray per nostril twice daily  -Continue with Singulair (montelukast) 1058maily.  -Continue sinus rinses daily before nasal sprays  - Continue to Follow-up with Dr. TeoBenjamine Molalease let us [place] if nasal polyps have returned as we do have new treatment options such as dupilumab  3. Gastroesophageal reflux disease - Continue with Nexium daily as needed   Follow up: 3 months   Thank you so much for letting me partake in your care today.  Don't hesitate to reach out if you have any additional concerns!  EveRoney MarionD  [facility] [place] [place]   No orders of the defined types were placed in this encounter.   Lab Orders  No laboratory test(s) ordered today   Diagnostics: None   Medication List:  Current Outpatient Medications  Medication Sig Dispense  Refill   acetaminophen (TYLENOL) 325 MG tablet Take 2 tablets (650 mg total) by mouth every 6 (six) hours as needed for mild pain or headache (fever >/= 101).     albuterol (VENTOLIN HFA) 108 (90 Base) MCG/ACT inhaler INHALE 2 PUFFS INTO THE LUNGS EVERY 4 HOURS AS NEEDED FOR WHEEZING OR SHORTNESS OF BREATH 8.5 g 1   amLODipine (NORVASC) 10 MG tablet Take 10 mg by  mouth daily.   3   atorvastatin (LIPITOR) 20 MG tablet Take by mouth.     budesonide-formoterol (SYMBICORT) 80-4.5 MCG/ACT inhaler Inhale 2 puffs into the lungs 2 (two) times daily. (Patient taking differently: Inhale 2 puffs into the lungs as needed.) 1 each 6   cetirizine (ZYRTEC) 10 MG tablet TAKE 1 TABLET BY MOUTH EVERY DAY AS NEEDED FOR ALLERGIES 30 tablet 5   esomeprazole (NEXIUM) 20 MG packet Take 20 mg by mouth daily before breakfast. (Patient taking differently: Take 20 mg by mouth as needed.) 30 each 5   Fluticasone Propionate (XHANCE) 93 MCG/ACT EXHU Place 1 spray into the nose in the morning and at bedtime. 16 mL 6   losartan (COZAAR) 50 MG tablet Take 50 mg by mouth daily.     montelukast (SINGULAIR) 10 MG tablet Take 1 tablet (10 mg total) by mouth at bedtime. 30 tablet 3   Spacer/Aero-Holding [facility] Use as directed with inhaler 1 each 0   No current facility-administered medications for this visit.   Allergies: Allergies  Allergen Reactions   Chocolate Rash   Other Rash    All carbonated drinks (soda)   I reviewed his past medical history, social history, family history, and environmental history and no significant changes have been reported from his previous visit.  ROS: All others negative except as noted per HPI.   Objective: BP 132/78   Pulse 90   Temp 98.1 F (36.7 C) (Temporal)   Resp 20   SpO2 98%  There is no height or weight on file to calculate BMI. General Appearance:  Alert, cooperative, no distress, appears stated age  Head:  Normocephalic, without obvious abnormality, atraumatic  Eyes:  Conjunctiva clear, EOM's intact  Nose: Nares normal, hypertrophic turbinates, no visible anterior polyps, and septum midline  Throat: Lips, tongue normal; teeth and gums normal, normal posterior oropharynx  Neck: Supple, symmetrical  Lungs:   clear to auscultation bilaterally, Respirations unlabored, no coughing  Heart:  regular rate and rhythm and no murmur,  Appears well perfused  Extremities: No edema  Skin: Skin color, texture, turgor normal, no rashes or lesions on visualized portions of skin  Neurologic: No gross deficits   Previous notes and tests were reviewed. The plan was reviewed with the patient/family, and all questions/concerned were addressed.  It was my pleasure to see Isaiah Sellers today and participate in his care. Please feel free to contact me with any questions or concerns.  Sincerely,  Roney Marion, MD  Allergy & Immunology  [facility] [place] [facility] Office: [phone]

## 2022-10-28 ENCOUNTER — Telehealth: Payer: Self-pay

## 2022-10-28 NOTE — Telephone Encounter (Signed)
In the last 2 week with the weather changes.  Has had a lot of breathing issues with also his allergies. He is having a lot of congestion, and ear ringing, some sinus headaches that have eased up. No wheezing, some coughing. No fever, no body aches no hills. He is doing, nasal sprays (asetlin, flonase) , doing symbicort as needed.He is doing saline, he does zyrtec prn.

## 2022-10-29 ENCOUNTER — Other Ambulatory Visit: Payer: Self-pay

## 2022-10-29 ENCOUNTER — Ambulatory Visit: Payer: 59 | Admitting: Family Medicine

## 2022-10-29 ENCOUNTER — Encounter: Payer: Self-pay | Admitting: Family Medicine

## 2022-10-29 ENCOUNTER — Ambulatory Visit: Payer: 59 | Admitting: Internal Medicine

## 2022-10-29 VITALS — BP 130/70 | HR 92 | Temp 98.2°F | Resp 20 | Wt 226.8 lb

## 2022-10-29 DIAGNOSIS — J4530 Mild persistent asthma, uncomplicated: Secondary | ICD-10-CM

## 2022-10-29 DIAGNOSIS — J330 Polyp of nasal cavity: Secondary | ICD-10-CM

## 2022-10-29 DIAGNOSIS — J3089 Other allergic rhinitis: Secondary | ICD-10-CM | POA: Diagnosis not present

## 2022-10-29 DIAGNOSIS — K219 Gastro-esophageal reflux disease without esophagitis: Secondary | ICD-10-CM

## 2022-10-29 DIAGNOSIS — J302 Other seasonal allergic rhinitis: Secondary | ICD-10-CM

## 2022-10-29 MED ORDER — PREDNISONE 10 MG PO TABS: ORAL_TABLET | ORAL | 0 refills | Status: DC

## 2022-10-29 NOTE — Patient Instructions (Addendum)
Asthma Continue montelukast 10 mg once a day to prevent cough or wheeze You may use albuterol 2 puffs once every 4 hours as needed for cough or wheeze You may use albuterol 2 puffs 5 to 15 minutes before activity to decrease cough or wheeze For asthma flare, begin Symbicort 160-2 puffs twice a day with a spacer for 2 weeks or until cough and wheeze free, then stop.  Allergic rhinitis Continue montelukast 10 mg once a day for allergy symptoms Prednisone 10 mg tablets. Take 2 tablets once a day for 4 days, then take 1 tablet on the 5th day, then stop.   Begin budesonide + saline rinses once a day for nasal symptoms Continue azelastine nasal spray 2 sprays in each nostril up to twice a day as needed for a runny nose Consider saline nasal rinses as needed for nasal symptoms. Use this before any medicated nasal sprays for best result Consider updating your environmental allergy testing  History of nasal polyposis Continue nasal rinses with saline and budesonide We will submit to your insurance for Dupixent injections. You will hear from our [place], with next steps.   Reflux Continue dietary lifestyle modifications as listed below  Call the clinic if this treatment plan is not working well for you  Follow up in 1 month or sooner if needed.   Lifestyle Changes for Controlling GERD When you have GERD, stomach acid feels as if it's backing up toward your mouth. Whether or not you take medication to control your GERD, your symptoms can often be improved with lifestyle changes.   Raise Your Head Reflux is more likely to strike when you're lying down flat, because stomach fluid can flow backward more easily. Raising the head of your bed 4-6 inches can help. To do this: Slide blocks or books under the legs at the head of your bed. Or, place a wedge under the mattress. Many foam stores can make a suitable wedge for you. The wedge should run from your waist to the top of your  head. Don't just prop your head on several pillows. This increases pressure on your stomach. It can make GERD worse.  Watch Your Eating Habits Certain foods may increase the acid in your stomach or relax the lower esophageal sphincter, making GERD more likely. It's best to avoid the following: Coffee, tea, and carbonated drinks (with and without caffeine) Fatty, fried, or spicy food Mint, chocolate, onions, and tomatoes Any other foods that seem to irritate your stomach or cause you pain  Relieve the Pressure Eat smaller meals, even if you have to eat more often. Don't lie down right after you eat. Wait a few hours for your stomach to empty. Avoid tight belts and tight-fitting clothes. Lose excess weight.  Tobacco and Alcohol Avoid smoking tobacco and drinking alcohol. They can make GERD symptoms worse.

## 2022-10-29 NOTE — Progress Notes (Signed)
[place] [id number] Dept: [phone]  FOLLOW UP NOTE  Patient ID: Isaiah Sellers, male    DOB: October 29, 1960  Age: 62 y.o. MRN: [id number] Date of Office Visit: 10/29/2022  Assessment  Chief Complaint: Sinusitis, Medication Refill, and Follow-up (Increased congestion due to pollen and weather change)  HPI Isaiah Sellers is a 62 year old male who presents to the clinic for evaluation of nasal congestion.  He was last seen in this clinic on 09/11/2022 by Dr. Edison Pace for evaluation of asthma, allergic rhinitis, history of nasal polyposis, and reflux.  At today's visit, he reports his allergic rhinitis has been poorly controlled with symptoms including nasal congestion, headache, and moderate postnasal drainage.  He reports the symptoms began between the end of February and 1 March and have been constant daily.  He reports crackling and popping in his left ear.  He denies ear pain or change in hearing.  He denies drainage from his ear.  He continues montelukast 10 mg once a day, azelastine 2 sprays in each nostril twice a day, and saline nasal rinses 2-3 times a day.  He has previously used Xhance nasal sprays with excellent relief of symptoms, however, his insurance company will no longer cover this medication.  He has previously seen Dr. Benjamine Mola, ENT specialist, for management of nasal polyposis with nasal surgery, however, he has recently retired.  Asthma is reported as moderately well-controlled with occasional wheeze occurring in the evening as the main symptom.  He denies shortness of breath or cough with activity or rest.  He reports that he does not have an albuterol inhaler and is currently using Symbicort 160 about once a week with relief of symptoms.  Reflux is reported as moderately well-controlled with only occasional heartburn as the main symptom.  He continues esomeprazole only as needed with relief of symptoms.  His current medications are listed in the  chart.   Drug Allergies:  Allergies  Allergen Reactions   Chocolate Rash   Other Rash    All carbonated drinks (soda)    Physical Exam: BP 130/70 (BP Location: Left Arm, Patient Position: Sitting, Cuff Size: Normal)   Pulse 92   Temp 98.2 F (36.8 C) (Temporal)   Resp 20   Wt 226 lb 12.8 oz (102.9 kg)   SpO2 98%   BMI 29.92 kg/m    Physical Exam Vitals reviewed.  Constitutional:      Appearance: Normal appearance.  HENT:     Head: Normocephalic and atraumatic.     Right Ear: Tympanic membrane normal.     Left Ear: Tympanic membrane normal.     Nose:     Comments: Bilateral naris edematous and erythematous with clear nasal drainage noted.  Pharynx normal.  Left TM retracted, right TM slightly erythematous with no bulging or fluid noted.  Eyes normal. Eyes:     Conjunctiva/sclera: Conjunctivae normal.  Cardiovascular:     Rate and Rhythm: Normal rate and regular rhythm.     Heart sounds: Normal heart sounds. No murmur heard. Pulmonary:     Effort: Pulmonary effort is normal.     Breath sounds: Normal breath sounds.     Comments: Lungs clear to auscultation Musculoskeletal:        General: Normal range of motion.     Cervical back: Normal range of motion and neck supple.  Skin:    General: Skin is warm and dry.  Neurological:     Mental Status: He is alert  and oriented to person, place, and time.  Psychiatric:        Mood and Affect: Mood normal.        Behavior: Behavior normal.        Thought Content: Thought content normal.        Judgment: Judgment normal.     Diagnostics: FVC 3.86 which is 90% of predicted value, FEV1 3.12 which is 95% of predicted value.  Spirometry indicates normal ventilatory function.  Assessment and Plan: 1. Mild persistent asthma without complication   2. Seasonal and perennial allergic rhinitis   3. Polyp of nasal cavity   4. Gastroesophageal reflux disease without esophagitis     Meds ordered this encounter  Medications    predniSONE (DELTASONE) 10 MG tablet    Sig: Prednisone 10 mg tablets. Take 2 tablets once a day for 4 days, then take 1 tablet on the 5th day, then stop.    Dispense:  9 tablet    Refill:  0    Patient Instructions  Asthma Continue montelukast 10 mg once a day to prevent cough or wheeze You may use albuterol 2 puffs once every 4 hours as needed for cough or wheeze You may use albuterol 2 puffs 5 to 15 minutes before activity to decrease cough or wheeze For asthma flare, begin Symbicort 160-2 puffs twice a day with a spacer for 2 weeks or until cough and wheeze free, then stop.  Allergic rhinitis Continue montelukast 10 mg once a day for allergy symptoms Prednisone 10 mg tablets. Take 2 tablets once a day for 4 days, then take 1 tablet on the 5th day, then stop.   Begin budesonide + saline rinses once a day for nasal symptoms Continue azelastine nasal spray 2 sprays in each nostril up to twice a day as needed for a runny nose Consider saline nasal rinses as needed for nasal symptoms. Use this before any medicated nasal sprays for best result Consider updating your environmental allergy testing  History of nasal polyposis Continue nasal rinses with saline and budesonide We will submit to your insurance for Dupixent injections. You will hear from our [facility], with next steps.   Reflux Continue dietary lifestyle modifications as listed below  Call the clinic if this treatment plan is not working well for you  Follow up in 1 month or sooner if needed.   Return in about 4 weeks (around 11/26/2022), or if symptoms worsen or fail to improve.    Thank you for the opportunity to care for this patient.  Please do not hesitate to contact me with questions.  Gareth Morgan, FNP [facility]

## 2022-10-29 NOTE — Telephone Encounter (Signed)
He may need steroids and antibiotics, can we see if we get him in earlier for a quick physical exam?

## 2022-10-29 NOTE — Telephone Encounter (Signed)
Pt seen today

## 2022-11-06 ENCOUNTER — Other Ambulatory Visit: Payer: Self-pay

## 2022-11-06 MED ORDER — ALBUTEROL SULFATE HFA 108 (90 BASE) MCG/ACT IN AERS: INHALATION_SPRAY | RESPIRATORY_TRACT | 1 refills | Status: DC

## 2022-11-06 MED ORDER — BUDESONIDE-FORMOTEROL FUMARATE 160-4.5 MCG/ACT IN AERO: INHALATION_SPRAY | RESPIRATORY_TRACT | 2 refills | Status: DC

## 2022-11-06 NOTE — Telephone Encounter (Signed)
Patient calling stating his refill medications did not make it to the pharmacy. Resending medications into [facility] [facility]

## 2022-11-12 ENCOUNTER — Telehealth: Payer: Self-pay | Admitting: *Deleted

## 2022-11-12 NOTE — Telephone Encounter (Signed)
Called patient and advised approval for Dupixent with instructions on submit to [employer], copay card, delivery, storage and initial injection in clinic for admin instructions

## 2022-11-12 NOTE — Telephone Encounter (Signed)
-----   Message from Hetty Blend, FNP sent at 10/29/2022  5:09 PM EDT ----- Can you please submit this patient for Dupixent for nasal polyp control? Thank you

## 2022-11-13 NOTE — Telephone Encounter (Signed)
Thank you :)

## 2022-11-20 ENCOUNTER — Other Ambulatory Visit: Payer: Self-pay | Admitting: Internal Medicine

## 2022-11-20 ENCOUNTER — Encounter: Payer: Self-pay | Admitting: Internal Medicine

## 2022-11-20 ENCOUNTER — Ambulatory Visit: Payer: 59 | Admitting: Internal Medicine

## 2022-11-20 VITALS — BP 128/72 | HR 88 | Temp 98.6°F | Resp 18 | Wt 236.2 lb

## 2022-11-20 DIAGNOSIS — J330 Polyp of nasal cavity: Secondary | ICD-10-CM | POA: Diagnosis not present

## 2022-11-20 DIAGNOSIS — J0100 Acute maxillary sinusitis, unspecified: Secondary | ICD-10-CM | POA: Diagnosis not present

## 2022-11-20 DIAGNOSIS — J4530 Mild persistent asthma, uncomplicated: Secondary | ICD-10-CM | POA: Diagnosis not present

## 2022-11-20 DIAGNOSIS — J3089 Other allergic rhinitis: Secondary | ICD-10-CM

## 2022-11-20 DIAGNOSIS — J302 Other seasonal allergic rhinitis: Secondary | ICD-10-CM

## 2022-11-20 DIAGNOSIS — K219 Gastro-esophageal reflux disease without esophagitis: Secondary | ICD-10-CM

## 2022-11-20 MED ORDER — DOXYCYCLINE MONOHYDRATE 100 MG PO TABS: 100.0000 mg | ORAL_TABLET | Freq: Two times a day (BID) | ORAL | 0 refills | Status: AC

## 2022-11-20 MED ORDER — METHYLPREDNISOLONE ACETATE 40 MG/ML IJ SUSP
40.0000 mg | Freq: Once | INTRAMUSCULAR | Status: AC
  Administered 2022-11-20: 40 mg via INTRAMUSCULAR

## 2022-11-20 MED ORDER — BUDESONIDE-FORMOTEROL FUMARATE 80-4.5 MCG/ACT IN AERO: 2.0000 | INHALATION_SPRAY | RESPIRATORY_TRACT | 5 refills | Status: AC | PRN

## 2022-11-20 MED ORDER — ALBUTEROL SULFATE HFA 108 (90 BASE) MCG/ACT IN AERS: INHALATION_SPRAY | RESPIRATORY_TRACT | 1 refills | Status: DC

## 2022-11-20 MED ORDER — BUDESONIDE 0.5 MG/2ML IN SUSP: 0.5000 mg | Freq: Every day | RESPIRATORY_TRACT | 0 refills | Status: DC

## 2022-11-20 MED ORDER — MONTELUKAST SODIUM 10 MG PO TABS: 10.0000 mg | ORAL_TABLET | Freq: Every day | ORAL | 3 refills | Status: DC

## 2022-11-20 MED ORDER — PREDNISONE 10 MG PO TABS: ORAL_TABLET | ORAL | 0 refills | Status: DC

## 2022-11-20 NOTE — Progress Notes (Signed)
Follow Up Note  RE: Isaiah Sellers MRN: [id number] DOB: 1961-07-12 Date of Office Visit: 11/20/2022  Referring provider: Andreas Blower., MD Primary care provider: Andreas Blower., MD  Chief Complaint: Other (Pt states sinus headache, nasal headache sinus pressure.), Allergic Rhinitis , and Asthma  History of Present Illness: I had the pleasure of seeing Isaiah Sellers for a follow up visit at the [facility] on 11/20/2022. He is a 62 y.o. male, who is being followed for persistent asthma, nasal polyposis, allergic rhinitis GERD. His previous allergy office visit was on 06/11/22 with Dr. [name]. Today is a regular follow up visit.  History obtained from patient, chart review.  At last visit he was treated for nasal polyposis with prednisone   Today he reports 2 weeks of increased nasal congestion.  Cannot breathe through his nose.  Worsening hyposmia.  He has been approved for Dupixent but has not received his first shipment.  He is continued his montelukast, budesonide rinses, azelastine and is using Afrin intermittently for relief at night.  Also reports sinus tenderness, headache, ear fullness.  Subjective fevers.  Assessment and Plan: [name] is a 62 y.o. male with: Acute non-recurrent maxillary sinusitis  Mild persistent asthma without complication  Seasonal and perennial allergic rhinitis  Polyp of nasal cavity  Other allergic rhinitis  Gastroesophageal reflux disease without esophagitis   Plan: Patient Instructions  Acute sinus infection Depo 40 mg IM given today Prednisone 10 mg tablets. Take 2 tablets once a day for 4 days, then take 1 tablet on the 5th day, then stop.  Start tomorrow Start doxycycline 100 mg twice daily for 7 days   Asthma Continue montelukast 10 mg once a day to prevent cough or wheeze You may use albuterol 2 puffs once every 4 hours as needed for cough or wheeze You may use albuterol 2 puffs 5 to 15 minutes before  activity to decrease cough or wheeze For asthma flare, begin Symbicort 160-2 puffs twice a day with a spacer for 2 weeks or until cough and wheeze free, then stop.  Allergic rhinitis Continue montelukast 10 mg once a day for allergy symptoms Continue budesonide + saline rinses once a day for nasal symptoms Continue azelastine nasal spray 2 sprays in each nostril up to twice a day as needed for a runny nose Consider saline nasal rinses as needed for nasal symptoms. Use this before any medicated nasal sprays for best result Consider updating your environmental allergy testing  History of nasal polyposis Continue nasal rinses with saline and budesonide We are still working on your Dupixent approval, Tammy should be reaching out to you when her shipment is arrived  Reflux Continue dietary lifestyle modifications  Call the clinic if this treatment plan is not working well for you  Follow up: for dupixent injection, follow up in 3 months   Thank you so much for letting me partake in your care today.  Don't hesitate to reach out if you have any additional concerns!  [name], MD  [facility] [place] [place]     Meds ordered this encounter  Medications   methylPREDNISolone acetate (DEPO-MEDROL) injection 40 mg   albuterol (VENTOLIN HFA) 108 (90 Base) MCG/ACT inhaler    Sig: INHALE 2 PUFFS INTO THE LUNGS EVERY 4 HOURS AS NEEDED FOR WHEEZING OR SHORTNESS OF BREATH    Dispense:  8.5 g    Refill:  1   budesonide-formoterol (SYMBICORT) 80-4.5 MCG/ACT inhaler  Sig: Inhale 2 puffs into the lungs as needed.    Dispense:  6.9 g    Refill:  5   montelukast (SINGULAIR) 10 MG tablet    Sig: Take 1 tablet (10 mg total) by mouth at bedtime.    Dispense:  30 tablet    Refill:  3   predniSONE (DELTASONE) 10 MG tablet    Sig: Prednisone 10 mg tablets. Take 2 tablets once a day for 4 days, then take 1 tablet on the 5th day, then stop.    Dispense:  9 tablet    Refill:  0    doxycycline (ADOXA) 100 MG tablet    Sig: Take 1 tablet (100 mg total) by mouth 2 (two) times daily for 7 days.    Dispense:  14 tablet    Refill:  0   budesonide (PULMICORT) 0.5 MG/2ML nebulizer solution    Sig: Take 2 mLs (0.5 mg total) by nebulization daily.    Dispense:  75 mL    Refill:  0    Lab Orders  No laboratory test(s) ordered today   Diagnostics: None   Medication List:  Current Outpatient Medications  Medication Sig Dispense Refill   acetaminophen (TYLENOL) 325 MG tablet Take 2 tablets (650 mg total) by mouth every 6 (six) hours as needed for mild pain or headache (fever >/= 101).     albuterol (VENTOLIN HFA) 108 (90 Base) MCG/ACT inhaler INHALE 2 PUFFS INTO THE LUNGS EVERY 4 HOURS AS NEEDED FOR WHEEZING OR SHORTNESS OF BREATH 8.5 g 1   amLODipine (NORVASC) 10 MG tablet Take 10 mg by mouth daily.   3   atorvastatin (LIPITOR) 20 MG tablet Take by mouth.     budesonide (PULMICORT) 0.5 MG/2ML nebulizer solution Take 2 mLs (0.5 mg total) by nebulization daily. 75 mL 0   budesonide-formoterol (SYMBICORT) 160-4.5 MCG/ACT inhaler 2 puffs twice daily to prevent coughing or wheezing. 1 each 2   budesonide-formoterol (SYMBICORT) 80-4.5 MCG/ACT inhaler Inhale 2 puffs into the lungs as needed. 6.9 g 5   cetirizine (ZYRTEC) 10 MG tablet TAKE 1 TABLET BY MOUTH EVERY DAY AS NEEDED FOR ALLERGIES 30 tablet 5   doxycycline (ADOXA) 100 MG tablet Take 1 tablet (100 mg total) by mouth 2 (two) times daily for 7 days. 14 tablet 0   esomeprazole (NEXIUM) 20 MG packet Take 20 mg by mouth daily before breakfast. (Patient taking differently: Take 20 mg by mouth as needed.) 30 each 5   Fluticasone Propionate (XHANCE) 93 MCG/ACT EXHU Place 1 spray into the nose in the morning and at bedtime. 16 mL 6   losartan (COZAAR) 50 MG tablet Take 50 mg by mouth daily.     montelukast (SINGULAIR) 10 MG tablet Take 1 tablet (10 mg total) by mouth at bedtime. 30 tablet 3   Spacer/Aero-Holding [facility]  Use as directed with inhaler 1 each 0   predniSONE (DELTASONE) 10 MG tablet Prednisone 10 mg tablets. Take 2 tablets once a day for 4 days, then take 1 tablet on the 5th day, then stop. 9 tablet 0   No current facility-administered medications for this visit.   Allergies: Allergies  Allergen Reactions   Chocolate Rash   Other Rash    All carbonated drinks (soda)   I reviewed his past medical history, social history, family history, and environmental history and no significant changes have been reported from his previous visit.  ROS: All others negative except as noted per HPI.   Objective:  BP 128/72   Pulse 88   Temp 98.6 F (37 C) (Temporal)   Resp 18   Wt 236 lb 3.2 oz (107.1 kg)   SpO2 98%   BMI 31.16 kg/m  Body mass index is 31.16 kg/m. General Appearance:  Alert, cooperative, no distress, appears stated age  Head:  Normocephalic, without obvious abnormality, atraumatic  Eyes:  Conjunctiva clear, EOM's intact, serous fluid bilaterally in ears  Nose: Nares normal, complete obstruction of the bilateral nasal cavities, sinus tenderness on maxillary sinuses hypertrophic turbinates, no visible anterior polyps, and septum midline  Throat: Lips, tongue normal; teeth and gums normal, normal posterior oropharynx  Neck: Supple, symmetrical  Lungs:   clear to auscultation bilaterally, Respirations unlabored, no coughing  Heart:  regular rate and rhythm and no murmur, Appears well perfused  Extremities: No edema  Skin: Skin color, texture, turgor normal, no rashes or lesions on visualized portions of skin  Neurologic: No gross deficits   Previous notes and tests were reviewed. The plan was reviewed with the patient/family, and all questions/concerned were addressed.  It was my pleasure to see Cypher today and participate in his care. Please feel free to contact me with any questions or concerns.  Sincerely,  [name], MD  Allergy & Immunology  [facility] Office: [phone]

## 2022-11-20 NOTE — Patient Instructions (Signed)
Acute sinus infection Depo 40 mg IM given today Prednisone 10 mg tablets. Take 2 tablets once a day for 4 days, then take 1 tablet on the 5th day, then stop.  Start tomorrow Start doxycycline 100 mg twice daily for 7 days   Asthma Continue montelukast 10 mg once a day to prevent cough or wheeze You may use albuterol 2 puffs once every 4 hours as needed for cough or wheeze You may use albuterol 2 puffs 5 to 15 minutes before activity to decrease cough or wheeze For asthma flare, begin Symbicort 160-2 puffs twice a day with a spacer for 2 weeks or until cough and wheeze free, then stop.  Allergic rhinitis Continue montelukast 10 mg once a day for allergy symptoms Continue budesonide + saline rinses once a day for nasal symptoms Continue azelastine nasal spray 2 sprays in each nostril up to twice a day as needed for a runny nose Consider saline nasal rinses as needed for nasal symptoms. Use this before any medicated nasal sprays for best result Consider updating your environmental allergy testing  History of nasal polyposis Continue nasal rinses with saline and budesonide We are still working on your Dupixent approval, Tammy should be reaching out to you when her shipment is arrived  Reflux Continue dietary lifestyle modifications  Call the clinic if this treatment plan is not working well for you  Follow up: for dupixent injection, follow up in 3 months   Thank you so much for letting me partake in your care today.  Don't hesitate to reach out if you have any additional concerns!  [name], MD  [facility] [place] [place]

## 2022-11-28 ENCOUNTER — Telehealth: Payer: Self-pay

## 2022-11-28 NOTE — Telephone Encounter (Signed)
Patient calling letting us know [employer] sent him a text. I told patient to call the [employer] to schedule dupixent delivery to his home. Patient will then call our office to schedule his first dupixent injection and instruct patient how to administer injection at home. Told patient to call office if having a trouble.

## 2022-12-10 ENCOUNTER — Ambulatory Visit (INDEPENDENT_AMBULATORY_CARE_PROVIDER_SITE_OTHER): Payer: 59

## 2022-12-10 ENCOUNTER — Ambulatory Visit: Payer: 59 | Admitting: Internal Medicine

## 2022-12-10 DIAGNOSIS — J339 Nasal polyp, unspecified: Secondary | ICD-10-CM

## 2022-12-10 MED ORDER — DUPILUMAB 300 MG/2ML ~~LOC~~ SOSY
300.0000 mg | PREFILLED_SYRINGE | Freq: Once | SUBCUTANEOUS | Status: AC
  Administered 2022-12-10 – 2023-01-13 (×3): 300 mg via SUBCUTANEOUS

## 2022-12-10 NOTE — Progress Notes (Signed)
Immunotherapy   Patient Details  Name: Isaiah Sellers MRN: [id number] Date of Birth: April 09, 1961  12/10/2022  Isaiah Sellers started Dupixent 300 mg today  Frequency: Q 2 weeks Consent signed and patient instructions given. Patient will be self administrating Dupixent at home after 3rd injection.   Isaiah Sellers 12/10/2022, 3:35 PM

## 2022-12-25 ENCOUNTER — Ambulatory Visit (INDEPENDENT_AMBULATORY_CARE_PROVIDER_SITE_OTHER): Payer: 59

## 2022-12-25 DIAGNOSIS — J339 Nasal polyp, unspecified: Secondary | ICD-10-CM | POA: Diagnosis not present

## 2023-01-08 ENCOUNTER — Ambulatory Visit: Payer: 59

## 2023-01-13 ENCOUNTER — Ambulatory Visit (INDEPENDENT_AMBULATORY_CARE_PROVIDER_SITE_OTHER): Payer: 59

## 2023-01-13 DIAGNOSIS — J339 Nasal polyp, unspecified: Secondary | ICD-10-CM | POA: Diagnosis not present

## 2023-02-19 ENCOUNTER — Ambulatory Visit: Payer: 59 | Admitting: Internal Medicine

## 2023-02-19 VITALS — BP 108/68 | HR 94 | Temp 98.2°F | Resp 20 | Wt 226.2 lb

## 2023-02-19 DIAGNOSIS — J330 Polyp of nasal cavity: Secondary | ICD-10-CM

## 2023-02-19 DIAGNOSIS — H6502 Acute serous otitis media, left ear: Secondary | ICD-10-CM

## 2023-02-19 DIAGNOSIS — J4530 Mild persistent asthma, uncomplicated: Secondary | ICD-10-CM | POA: Diagnosis not present

## 2023-02-19 DIAGNOSIS — K219 Gastro-esophageal reflux disease without esophagitis: Secondary | ICD-10-CM

## 2023-02-19 DIAGNOSIS — J302 Other seasonal allergic rhinitis: Secondary | ICD-10-CM

## 2023-02-19 DIAGNOSIS — J3089 Other allergic rhinitis: Secondary | ICD-10-CM

## 2023-02-19 MED ORDER — AMOXICILLIN-POT CLAVULANATE 875-125 MG PO TABS: 1.0000 | ORAL_TABLET | Freq: Two times a day (BID) | ORAL | 0 refills | Status: AC

## 2023-02-19 NOTE — Patient Instructions (Addendum)
Acute Otitis Media- Left ear  - Start Augmentin 875/125mg  twice a day for 10 days   Asthma Will get spiro at next visit  Continue montelukast 10 mg once a day to prevent cough or wheeze You may use albuterol 2 puffs once every 4 hours as needed for cough or wheeze You may use albuterol 2 puffs 5 to 15 minutes before activity to decrease cough or wheeze For asthma flare, begin Symbicort 160-2 puffs twice a day with a spacer for 2 weeks or until cough and wheeze free, then stop.  Allergic rhinitis; baseline  Continue montelukast 10 mg once a day for allergy symptoms Continue budesonide + saline rinses once a day for nasal symptoms Continue azelastine nasal spray 2 sprays in each nostril up to twice a day as needed for a runny nose  History of nasal polyposis Continue nasal rinses with saline and budesonide Continue dupixent injections   Reflux Continue dietary lifestyle modifications  Call the clinic if this treatment plan is not working well for you  Follow up: in 3 months   Thank you so much for letting me partake in your care today.  Don't hesitate to reach out if you have any additional concerns!  [name], MD  [facility] [place] [place]

## 2023-02-19 NOTE — Progress Notes (Signed)
Follow Up Note  RE: Isaiah Sellers MRN: [id number] DOB: September 24, 1960 Date of Office Visit: 02/19/2023  Referring provider: Andreas Blower., MD Primary care provider: Andreas Blower., MD  Chief Complaint: Asthma, Allergic Rhinitis , Sinus Problem, Nasal Congestion, and Follow-up  History of Present Illness: I had the pleasure of seeing Isaiah Sellers for a follow up visit at the [facility] on 02/19/2023. He is a 62 y.o. male, who is being followed for persistent asthma, nasal polyposis, allergic rhinitis GERD. His previous allergy office visit was on 11/20/22 with Dr. [name]. Today is a regular follow up visit.  History obtained from patient, chart review.  At last visit he was treated for acute sinusitis with depomedrol injection and doxycyxline   Today he reports Persistent nasal congestion, which is at baseline.  He is using his budesonide rinses, montelukast, azelastine.  Does feel like corticosteroids given the best relief for his nasal congestion but always returns to baseline.  He has started Dupixent which he feels like may be keeping sinus infections and nasal congestion at baseline.  Denies any worsening hyposmia has noticed left ear pain and ear popping over the past few weeks.  Asthma is well-controlled has not needed his albuterol or Symbicort.  Denies any breakthrough GERD.  Denies any adverse effects of medication Assessment and Plan: Myka is a 62 y.o. male with: Non-recurrent acute serous otitis media of left ear  Mild persistent asthma without complication  Polyp of nasal cavity  Seasonal and perennial allergic rhinitis  Gastroesophageal reflux disease without esophagitis   Plan: Patient Instructions  Acute Otitis Media- Left ear  - Start Augmentin 875/125mg  twice a day for 10 days   Asthma Will get spiro at next visit  Continue montelukast 10 mg once a day to prevent cough or wheeze You may use albuterol 2 puffs once every 4 hours as  needed for cough or wheeze You may use albuterol 2 puffs 5 to 15 minutes before activity to decrease cough or wheeze For asthma flare, begin Symbicort 160-2 puffs twice a day with a spacer for 2 weeks or until cough and wheeze free, then stop.  Allergic rhinitis; baseline  Continue montelukast 10 mg once a day for allergy symptoms Continue budesonide + saline rinses once a day for nasal symptoms Continue azelastine nasal spray 2 sprays in each nostril up to twice a day as needed for a runny nose  History of nasal polyposis Continue nasal rinses with saline and budesonide Continue dupixent injections   Reflux Continue dietary lifestyle modifications  Call the clinic if this treatment plan is not working well for you  Follow up: in 3 months   Thank you so much for letting me partake in your care today.  Don't hesitate to reach out if you have any additional concerns!  [name], MD  [facility] [place] [place]    Meds ordered this encounter  Medications   amoxicillin-clavulanate (AUGMENTIN) 875-125 MG tablet    Sig: Take 1 tablet by mouth 2 (two) times daily for 10 days.    Dispense:  20 tablet    Refill:  0    Lab Orders  No laboratory test(s) ordered today   Diagnostics: None   Medication List:  Current Outpatient Medications  Medication Sig Dispense Refill   acetaminophen (TYLENOL) 325 MG tablet Take 2 tablets (650 mg total) by mouth every 6 (six) hours as needed for mild pain or headache (fever >/=  101).     albuterol (VENTOLIN HFA) 108 (90 Base) MCG/ACT inhaler INHALE 2 PUFFS INTO THE LUNGS EVERY 4 HOURS AS NEEDED FOR WHEEZING OR SHORTNESS OF BREATH 8.5 g 1   amLODipine (NORVASC) 10 MG tablet Take 10 mg by mouth daily.   3   amoxicillin-clavulanate (AUGMENTIN) 875-125 MG tablet Take 1 tablet by mouth 2 (two) times daily for 10 days. 20 tablet 0   atorvastatin (LIPITOR) 20 MG tablet Take by mouth.     budesonide (PULMICORT) 0.5 MG/2ML nebulizer  solution USE 2 ML(0.5 MG) VIA NEBULIZER DAILY 180 mL 1   budesonide-formoterol (SYMBICORT) 160-4.5 MCG/ACT inhaler 2 puffs twice daily to prevent coughing or wheezing. 1 each 2   budesonide-formoterol (SYMBICORT) 80-4.5 MCG/ACT inhaler Inhale 2 puffs into the lungs as needed. 6.9 g 5   cetirizine (ZYRTEC) 10 MG tablet TAKE 1 TABLET BY MOUTH EVERY DAY AS NEEDED FOR ALLERGIES 30 tablet 5   esomeprazole (NEXIUM) 20 MG packet Take 20 mg by mouth daily before breakfast. (Patient taking differently: Take 20 mg by mouth as needed.) 30 each 5   Fluticasone Propionate (XHANCE) 93 MCG/ACT EXHU Place 1 spray into the nose in the morning and at bedtime. 16 mL 6   losartan (COZAAR) 50 MG tablet Take 50 mg by mouth daily.     montelukast (SINGULAIR) 10 MG tablet Take 1 tablet (10 mg total) by mouth at bedtime. 30 tablet 3   Spacer/Aero-Holding [facility] Use as directed with inhaler 1 each 0   predniSONE (DELTASONE) 10 MG tablet Prednisone 10 mg tablets. Take 2 tablets once a day for 4 days, then take 1 tablet on the 5th day, then stop. (Patient not taking: Reported on 02/19/2023) 9 tablet 0   No current facility-administered medications for this visit.   Allergies: Allergies  Allergen Reactions   Chocolate Rash   Other Rash    All carbonated drinks (soda)   I reviewed his past medical history, social history, family history, and environmental history and no significant changes have been reported from his previous visit.  ROS: All others negative except as noted per HPI.   Objective: BP 108/68   Pulse 94   Temp 98.2 F (36.8 C) (Temporal)   Resp 20   Wt 226 lb 3.2 oz (102.6 kg)   SpO2 97%   BMI 29.84 kg/m  Body mass index is 29.84 kg/m. General Appearance:  Alert, cooperative, no distress, appears stated age  Head:  Normocephalic, without obvious abnormality, atraumatic  Eyes:  Conjunctiva clear, EOM's intact, serous fluid bilaterally in ears  Ears/ Nose: Nares normal,   serous fluid R-TM,  L-TM erythematous and bulging with serous fluid , hypertrophic turbinates, no visible anterior polyps, and septum midline, edematous and erythematous nasal mucosa with clear- yellow to rhinnorhea   Throat: Lips, tongue normal; teeth and gums normal, normal posterior oropharynx  Neck: Supple, symmetrical  Lungs:   clear to auscultation bilaterally, Respirations unlabored, no coughing  Heart:  regular rate and rhythm and no murmur, Appears well perfused  Extremities: No edema  Skin: Skin color, texture, turgor normal, no rashes or lesions on visualized portions of skin  Neurologic: No gross deficits   Previous notes and tests were reviewed. The plan was reviewed with the patient/family, and all questions/concerned were addressed.  It was my pleasure to see Rodrecus today and participate in his care. Please feel free to contact me with any questions or concerns.  Sincerely,  [name], MD  Allergy &  Immunology  [facility] Office: [phone]

## 2023-03-25 ENCOUNTER — Other Ambulatory Visit: Payer: Self-pay | Admitting: Family Medicine

## 2023-04-09 ENCOUNTER — Other Ambulatory Visit: Payer: Self-pay | Admitting: Internal Medicine

## 2023-05-26 ENCOUNTER — Ambulatory Visit: Payer: 59 | Admitting: Internal Medicine

## 2023-05-26 VITALS — BP 132/74

## 2023-05-26 DIAGNOSIS — J3089 Other allergic rhinitis: Secondary | ICD-10-CM

## 2023-05-26 DIAGNOSIS — K219 Gastro-esophageal reflux disease without esophagitis: Secondary | ICD-10-CM

## 2023-05-26 DIAGNOSIS — J302 Other seasonal allergic rhinitis: Secondary | ICD-10-CM

## 2023-05-26 DIAGNOSIS — J330 Polyp of nasal cavity: Secondary | ICD-10-CM | POA: Diagnosis not present

## 2023-05-26 DIAGNOSIS — J4530 Mild persistent asthma, uncomplicated: Secondary | ICD-10-CM | POA: Diagnosis not present

## 2023-05-26 DIAGNOSIS — J3489 Other specified disorders of nose and nasal sinuses: Secondary | ICD-10-CM

## 2023-05-26 MED ORDER — METHYLPREDNISOLONE ACETATE 40 MG/ML IJ SUSP
40.0000 mg | Freq: Once | INTRAMUSCULAR | Status: AC
Start: 2023-05-26 — End: 2023-05-26
  Administered 2023-05-26: 40 mg via INTRAMUSCULAR

## 2023-05-26 MED ORDER — AZELASTINE HCL 0.1 % NA SOLN: 2.0000 | Freq: Two times a day (BID) | NASAL | 5 refills | Status: DC | PRN

## 2023-05-26 MED ORDER — PREDNISONE 10 MG PO TABS: ORAL_TABLET | ORAL | 0 refills | Status: DC

## 2023-05-26 NOTE — Progress Notes (Signed)
Follow Up Note  RE: Isaiah Sellers MRN: [id number] DOB: 1960-12-09 Date of Office Visit: 05/26/2023  Referring provider: Andreas Blower., MD Primary care provider: Andreas Blower., MD  Chief Complaint: No chief complaint on file.  History of Present Illness: I had the pleasure of seeing Isaiah Sellers for a follow up visit at the [facility] on 05/26/2023. He is a 62 y.o. male, who is being followed for persistent asthma, nasal polyposis, allergic rhinitis GERD. His previous allergy office visit was on 11/20/22 with Dr. [name]. Today is a regular follow up visit.  History obtained from patient, chart review.  At last visit he was treated for  acute otitis media with augmentin.    Noncompliance with multiple medications using symbicort, nexium, singulair as needed.  Stopped dupixent due to approval issues.   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 Since discontinuation, he has experienced sinus symptoms including congestion, ear popping, and sinus pressure, particularly with the change in weather. He has been managing these symptoms with NealMed rinse with budesonide two to three times a day, and daily monoleucast. Not using astelin.    In addition, the patient reported a recent episode of coughing which has resulted in difficulty swallowing. He also used his albuterol with a spacer with good response during this episode.  He has been using Afrin, up to two times a day, particularly at night, and has been experiencing worsening nasal congestion.  Mild worsening hyposmia   The patient has been adhering to his asthma medication regimen and reports no issues with his breathing.   Assessment and Plan: Isaiah Sellers is a 62 y.o. male with: Seasonal and perennial allergic rhinitis  Mild persistent asthma without complication - Plan: Spirometry with Graph  Polyp of nasal  cavity  Gastroesophageal reflux disease without esophagitis  Nasal obstruction   Plan: Patient Instructions  Asthma: mild persistent, well controlled  Breathing test looks good today Continue montelukast 10 mg once a day to prevent cough or wheeze You may use albuterol 2 puffs once every 4 hours as needed for cough or wheeze You may use albuterol 2 puffs 5 to 15 minutes before activity to decrease cough or wheeze For asthma flare, begin Symbicort 160-2 puffs twice a day with a spacer for 2 weeks or until cough and wheeze free, then stop.  Allergic rhinitis; with flare and complete nasal obstruction  Depo medrol 40mg  IM given today in clinic  Starting tomorrow: Prednisone 10mg  : Take 2 tablets twice a day for 3 more days, Then take 2 tablets once a day for 1 day., then take 1 tablet once a day for 1 day.   STOP all afrin use   Continue montelukast 10 mg once a day for allergy symptoms Continue budesonide + saline rinses  twice daily  Continue azelastine nasal spray 2 sprays in each nostril up to twice a day as needed for a runny nose  History of nasal polyposis Continue nasal rinses with saline and budesonide  RESTART dupixent injections   Tammy will reach out to you in regards to approval   Reflux Continue dietary lifestyle modifications  Call the clinic if this treatment plan is not working well for you  Follow up: in 3 months   Thank you so much for letting me partake in your care today.  Don't hesitate to reach out if you have any  additional concerns!  [name], MD  [facility] [place] [place]   Meds ordered this encounter  Medications   predniSONE (DELTASONE) 10 MG tablet    Sig: Prednisone 10 mg tablets. Take 2 tablets once a day for 4 days, then take 1 tablet on the 5th day, then stop.    Dispense:  9 tablet    Refill:  0   azelastine (ASTELIN) 0.1 % nasal spray    Sig: Place 2 sprays into both nostrils 2 (two) times daily as needed.     Dispense:  30 mL    Refill:  5   methylPREDNISolone acetate (DEPO-MEDROL) injection 40 mg    Lab Orders  No laboratory test(s) ordered today   Diagnostics: Spirometry:  Tracings reviewed. His effort: Good reproducible efforts. FVC: 3.89 L FEV1: 2.80 L, 88% predicted FEV1/FVC ratio: 72% Interpretation: Spirometry consistent with normal pattern.  Please see scanned spirometry results for details.   Medication List:  Current Outpatient Medications  Medication Sig Dispense Refill   azelastine (ASTELIN) 0.1 % nasal spray Place 2 sprays into both nostrils 2 (two) times daily as needed. 30 mL 5   DUPIXENT 300 MG/2ML prefilled syringe Inject into the skin.     acetaminophen (TYLENOL) 325 MG tablet Take 2 tablets (650 mg total) by mouth every 6 (six) hours as needed for mild pain or headache (fever >/= 101).     albuterol (VENTOLIN HFA) 108 (90 Base) MCG/ACT inhaler INHALE 2 PUFFS INTO THE LUNGS EVERY 4 HOURS AS NEEDED FOR WHEEZING OR SHORTNESS OF BREATH 8.5 g 1   amLODipine (NORVASC) 10 MG tablet Take 10 mg by mouth daily.   3   atorvastatin (LIPITOR) 20 MG tablet Take by mouth.     budesonide (PULMICORT) 0.5 MG/2ML nebulizer solution USE 2 ML(0.5 MG) VIA NEBULIZER DAILY 180 mL 1   budesonide-formoterol (SYMBICORT) 160-4.5 MCG/ACT inhaler 2 puffs twice daily to prevent coughing or wheezing. 1 each 2   budesonide-formoterol (SYMBICORT) 80-4.5 MCG/ACT inhaler Inhale 2 puffs into the lungs as needed. 6.9 g 5   cetirizine (ZYRTEC) 10 MG tablet TAKE 1 TABLET BY MOUTH EVERY DAY AS NEEDED FOR ALLERGIES 30 tablet 5   esomeprazole (NEXIUM) 20 MG packet Take 20 mg by mouth daily before breakfast. (Patient taking differently: Take 20 mg by mouth as needed.) 30 each 5   Fluticasone Propionate (XHANCE) 93 MCG/ACT EXHU Place 1 spray into the nose in the morning and at bedtime. 16 mL 6   losartan (COZAAR) 50 MG tablet Take 50 mg by mouth daily.     montelukast (SINGULAIR) 10 MG tablet Take 1 tablet (10 mg  total) by mouth at bedtime. 30 tablet 3   predniSONE (DELTASONE) 10 MG tablet Prednisone 10 mg tablets. Take 2 tablets once a day for 4 days, then take 1 tablet on the 5th day, then stop. 9 tablet 0   Spacer/Aero-Holding [facility] Use as directed with inhaler 1 each 0   No current facility-administered medications for this visit.   Allergies: Allergies  Allergen Reactions   Chocolate Rash   Other Rash    All carbonated drinks (soda)   I reviewed his past medical history, social history, family history, and environmental history and no significant changes have been reported from his previous visit.  ROS: All others negative except as noted per HPI.   Objective: BP 132/74  There is no height or weight on file to calculate BMI. General Appearance:  Alert, cooperative, no  distress, appears stated age  Head:  Normocephalic, without obvious abnormality, atraumatic  Eyes:  Conjunctiva clear, EOM's intact, serous fluid bilaterally in ears  Ears/ Nose: Nares normal,   serous fluid R-TM, L-TM erythematous and bulging with serous fluid , hypertrophic turbinates, no visible anterior polyps, and septum midline, edematous and erythematous nasal mucosa with clear- yellow to rhinnorhea   Throat: Lips, tongue normal; teeth and gums normal, normal posterior oropharynx  Neck: Supple, symmetrical  Lungs:   clear to auscultation bilaterally, Respirations unlabored, no coughing  Heart:  regular rate and rhythm and no murmur, Appears well perfused  Extremities: No edema  Skin: Skin color, texture, turgor normal, no rashes or lesions on visualized portions of skin  Neurologic: No gross deficits   Previous notes and tests were reviewed. The plan was reviewed with the patient/family, and all questions/concerned were addressed.  It was my pleasure to see Isaiah Sellers today and participate in his care. Please feel free to contact me with any questions or concerns.  Sincerely,  [name], MD  Allergy  & Immunology  [facility] Office: [phone]

## 2023-05-26 NOTE — Patient Instructions (Addendum)
Asthma: mild persistent, well controlled  Breathing test looks good today Continue montelukast 10 mg once a day to prevent cough or wheeze You may use albuterol 2 puffs once every 4 hours as needed for cough or wheeze You may use albuterol 2 puffs 5 to 15 minutes before activity to decrease cough or wheeze For asthma flare, begin Symbicort 160-2 puffs twice a day with a spacer for 2 weeks or until cough and wheeze free, then stop.  Allergic rhinitis; with flare and complete nasal obstruction  Depo medrol 40mg  IM given today in clinic  Starting tomorrow: Prednisone 10mg  : Take 2 tablets twice a day for 3 more days, Then take 2 tablets once a day for 1 day., then take 1 tablet once a day for 1 day.   STOP all afrin use   Continue montelukast 10 mg once a day for allergy symptoms Continue budesonide + saline rinses  twice daily  Continue azelastine nasal spray 2 sprays in each nostril up to twice a day as needed for a runny nose  History of nasal polyposis Continue nasal rinses with saline and budesonide  RESTART dupixent injections   Tammy will reach out to you in regards to approval   Reflux Continue dietary lifestyle modifications  Call the clinic if this treatment plan is not working well for you  Follow up: in 3 months   Thank you so much for letting me partake in your care today.  Don't hesitate to reach out if you have any additional concerns!  [name], MD  [facility] [place] [place]

## 2023-05-27 ENCOUNTER — Telehealth: Payer: Self-pay | Admitting: *Deleted

## 2023-05-27 NOTE — Telephone Encounter (Signed)
-----   Message from [name] sent at 05/26/2023  4:19 PM EDT ----- For some reason Isaiah Sellers stopped his dupixent injection.  His nasal polyps have worsened and I would like to restart.  Thanks!

## 2023-05-27 NOTE — Telephone Encounter (Signed)
Called patient and advised he has approval for dupixent just needs to reach out to [facility] to reorder and restart

## 2023-07-01 ENCOUNTER — Other Ambulatory Visit: Payer: Self-pay

## 2023-07-03 ENCOUNTER — Ambulatory Visit: Payer: 59

## 2023-07-03 DIAGNOSIS — J339 Nasal polyp, unspecified: Secondary | ICD-10-CM

## 2023-07-03 MED ORDER — DUPILUMAB 300 MG/2ML ~~LOC~~ SOSY
300.0000 mg | PREFILLED_SYRINGE | SUBCUTANEOUS | Status: AC
  Administered 2023-07-03 – 2023-08-13 (×4): 300 mg via SUBCUTANEOUS

## 2023-07-31 ENCOUNTER — Ambulatory Visit: Payer: 59

## 2023-07-31 DIAGNOSIS — J339 Nasal polyp, unspecified: Secondary | ICD-10-CM

## 2023-08-13 ENCOUNTER — Ambulatory Visit: Payer: 59

## 2023-08-13 DIAGNOSIS — J339 Nasal polyp, unspecified: Secondary | ICD-10-CM | POA: Diagnosis not present

## 2023-08-26 ENCOUNTER — Ambulatory Visit: Payer: 59 | Admitting: Internal Medicine

## 2023-08-26 ENCOUNTER — Other Ambulatory Visit: Payer: Self-pay

## 2023-08-26 VITALS — BP 128/76 | HR 101 | Temp 98.1°F | Resp 20 | Ht 72.0 in | Wt 235.4 lb

## 2023-08-26 DIAGNOSIS — J302 Other seasonal allergic rhinitis: Secondary | ICD-10-CM

## 2023-08-26 DIAGNOSIS — K219 Gastro-esophageal reflux disease without esophagitis: Secondary | ICD-10-CM

## 2023-08-26 DIAGNOSIS — J4530 Mild persistent asthma, uncomplicated: Secondary | ICD-10-CM | POA: Diagnosis not present

## 2023-08-26 DIAGNOSIS — J330 Polyp of nasal cavity: Secondary | ICD-10-CM

## 2023-08-26 DIAGNOSIS — J3089 Other allergic rhinitis: Secondary | ICD-10-CM | POA: Diagnosis not present

## 2023-08-26 MED ORDER — DUPILUMAB 300 MG/2ML ~~LOC~~ SOSY
300.0000 mg | PREFILLED_SYRINGE | SUBCUTANEOUS | Status: AC
  Administered 2023-09-09 – 2024-02-05 (×11): 300 mg via SUBCUTANEOUS

## 2023-08-26 MED ORDER — DUPILUMAB 300 MG/2ML ~~LOC~~ SOSY
300.0000 mg | PREFILLED_SYRINGE | Freq: Once | SUBCUTANEOUS | Status: AC
Start: 2023-08-26 — End: 2023-08-26
  Administered 2023-08-26: 300 mg via SUBCUTANEOUS

## 2023-08-26 NOTE — Progress Notes (Signed)
Follow Up Note  RE: Isaiah Sellers MRN: [id number] DOB: 10/20/1960 Date of Office Visit: 08/26/2023  Referring provider: Andreas Blower., MD Primary care provider: Andreas Blower., MD  Chief Complaint: Follow-up (Follow up and due for dupixent)  History of Present Illness: I had the pleasure of seeing Isaiah Sellers for a follow up visit at the [facility] [place] on 08/26/2023. He is a 63 y.o. male, who is being followed for persistent asthma, nasal polyposis, allergic rhinitis GERD. His previous allergy office visit was on 05/26/23 with Dr. [name]. Today is a regular follow up visit.  History obtained from patient, chart review.    Dupixent has been effective in managing nasal polyps, as symptoms returned when the medication was previously held. They are currently using Dupixent injections and use fluticasone nasal spray.  Unsure if this is Nurse, adult.Marland Kitchen  Previously on budesonide sinus rinses twice daily however they can order for this is causing $60 a bottle.  He is amenable to using it during flareups as needed.  No issues with their sense of smell, nasal congestion, or increased rhinorrhea. Azelastine is used as needed for nasal itching, which helps with allergic symptoms but not with polyps.  They have asthma and use montelukast regularly. Symbicort is used as needed, particularly during weather changes that exacerbate symptoms. Symbicort is effective when used with a spacer, especially during seasonal transitions when the weather fluctuates. They have not needed Symbicort recently due to stable weather conditions.  No OCS or antibiotics since last visit.  No ER visits.  They experience reflux symptoms occasionally and use Nexium as needed, particularly after consuming spicy foods. They do not take Nexium daily and manage symptoms based on dietary triggers.  Denies any adverse effects of medications.  Assessment and Plan: Isaiah Sellers is a 62 y.o. male with: Mild  persistent asthma without complication - Plan: Spirometry with Graph  Polyp of nasal cavity  Seasonal and perennial allergic rhinitis  Gastroesophageal reflux disease without esophagitis   Plan: Patient Instructions  Asthma: mild persistent, well controlled  Breathing test looks good today Continue montelukast 10 mg once a day to prevent cough or wheeze You may use albuterol 2 puffs once every 4 hours as needed for cough or wheeze You may use albuterol 2 puffs 5 to 15 minutes before activity to decrease cough or wheeze For asthma flare, begin Symbicort 160-2 puffs twice a day with a spacer for 2 weeks or until cough and wheeze free, then stop.  Allergic rhinitis; Continue montelukast 10 mg once a day for allergy symptoms Continue Fluticasone 1 spray per nostril twice daily  Continue azelastine nasal spray 2 sprays in each nostril up to twice a day as needed for a runny nose For increase nasal congestion/ drainage start budesonide + saline rinses  twice daily for two weeks   History of nasal polyposis Continue  dupixent injections 300mg  every 2 weeks   Reflux Continue dietary lifestyle modifications  Call the clinic if this treatment plan is not working well for you  Follow up: in 3 months   Thank you so much for letting me partake in your care today.  Don't hesitate to reach out if you have any additional concerns!  [name], MD  [facility] [place] [place]   No orders of the defined types were placed in this encounter.   Lab Orders  No laboratory test(s) ordered today   Diagnostics: Spirometry:  Tracings reviewed. His  effort: Good reproducible efforts. FVC: 3.90L FEV1: 3.10 L, 98% predicted FEV1/FVC ratio: 79% Interpretation: Spirometry consistent with normal pattern.  Please see scanned spirometry results for details.   Medication List:  Current Outpatient Medications  Medication Sig Dispense Refill   acetaminophen (TYLENOL) 325 MG  tablet Take 2 tablets (650 mg total) by mouth every 6 (six) hours as needed for mild pain or headache (fever >/= 101).     albuterol (VENTOLIN HFA) 108 (90 Base) MCG/ACT inhaler INHALE 2 PUFFS INTO THE LUNGS EVERY 4 HOURS AS NEEDED FOR WHEEZING OR SHORTNESS OF BREATH 8.5 g 1   amLODipine (NORVASC) 10 MG tablet Take 10 mg by mouth daily.   3   atorvastatin (LIPITOR) 20 MG tablet Take by mouth.     azelastine (ASTELIN) 0.1 % nasal spray Place 2 sprays into both nostrils 2 (two) times daily as needed. 30 mL 5   budesonide-formoterol (SYMBICORT) 160-4.5 MCG/ACT inhaler 2 puffs twice daily to prevent coughing or wheezing. 1 each 2   budesonide-formoterol (SYMBICORT) 80-4.5 MCG/ACT inhaler Inhale 2 puffs into the lungs as needed. 6.9 g 5   cetirizine (ZYRTEC) 10 MG tablet TAKE 1 TABLET BY MOUTH EVERY DAY AS NEEDED FOR ALLERGIES 30 tablet 5   DUPIXENT 300 MG/2ML prefilled syringe Inject into the skin.     Fluticasone Propionate (XHANCE) 93 MCG/ACT EXHU Place 1 spray into the nose in the morning and at bedtime. 16 mL 6   losartan (COZAAR) 50 MG tablet Take 50 mg by mouth daily.     montelukast (SINGULAIR) 10 MG tablet Take 1 tablet (10 mg total) by mouth at bedtime. 30 tablet 3   Spacer/Aero-Holding [facility] Use as directed with inhaler 1 each 0   Current Facility-Administered Medications  Medication Dose Route Frequency Provider Last Rate Last Admin   dupilumab (DUPIXENT) prefilled syringe 300 mg  300 mg Subcutaneous Q14 Days [name], MD   300 mg at 08/13/23 1508   Allergies: Allergies  Allergen Reactions   Chocolate Rash   Other Rash    All carbonated drinks (soda)   I reviewed his past medical history, social history, family history, and environmental history and no significant changes have been reported from his previous visit.  ROS: All others negative except as noted per HPI.   Objective: BP 128/76   Pulse (!) 101   Temp 98.1 F (36.7 C) (Temporal)   Resp 20   Ht 6'  (1.829 m)   Wt 235 lb 6.4 oz (106.8 kg)   SpO2 96%   BMI 31.93 kg/m  Body mass index is 31.93 kg/m. General Appearance:  Alert, cooperative, no distress, appears stated age  Head:  Normocephalic, without obvious abnormality, atraumatic  Eyes:  Conjunctiva clear, EOM's intact, serous fluid bilaterally in ears  Ears/ Nose: Nares normal,   PALE mucosa, hypertrophic turbinates, no visible anterior polyps, and septum midline,    Throat: Lips, tongue normal; teeth and gums normal, normal posterior oropharynx  Neck: Supple, symmetrical  Lungs:   clear to auscultation bilaterally, Respirations unlabored, no coughing  Heart:  regular rate and rhythm and no murmur, Appears well perfused  Extremities: No edema  Skin: Skin color, texture, turgor normal, no rashes or lesions on visualized portions of skin  Neurologic: No gross deficits   Previous notes and tests were reviewed. The plan was reviewed with the patient/family, and all questions/concerned were addressed.  It was my pleasure to see Isaiah Sellers today and participate in his care. Please feel free  to contact me with any questions or concerns.  Sincerely,  [name], MD  Allergy & Immunology  [facility] Office: [phone]

## 2023-08-26 NOTE — Patient Instructions (Addendum)
Asthma: mild persistent, well controlled  Breathing test looks good today Continue montelukast 10 mg once a day to prevent cough or wheeze You may use albuterol 2 puffs once every 4 hours as needed for cough or wheeze You may use albuterol 2 puffs 5 to 15 minutes before activity to decrease cough or wheeze For asthma flare, begin Symbicort 160-2 puffs twice a day with a spacer for 2 weeks or until cough and wheeze free, then stop.  Allergic rhinitis; Continue montelukast 10 mg once a day for allergy symptoms Continue Fluticasone 1 spray per nostril twice daily  Continue azelastine nasal spray 2 sprays in each nostril up to twice a day as needed for a runny nose For increase nasal congestion/ drainage start budesonide + saline rinses  twice daily for two weeks   History of nasal polyposis Continue  dupixent injections 300mg  every 2 weeks   Reflux Continue dietary lifestyle modifications  Call the clinic if this treatment plan is not working well for you  Follow up: in 3 months   Thank you so much for letting me partake in your care today.  Don't hesitate to reach out if you have any additional concerns!  [name], MD  [facility] [place], [place]

## 2023-08-27 ENCOUNTER — Ambulatory Visit: Payer: 59

## 2023-09-07 ENCOUNTER — Other Ambulatory Visit: Payer: Self-pay | Admitting: Internal Medicine

## 2023-09-09 ENCOUNTER — Ambulatory Visit: Payer: 59

## 2023-09-09 DIAGNOSIS — J339 Nasal polyp, unspecified: Secondary | ICD-10-CM

## 2023-09-09 DIAGNOSIS — J4530 Mild persistent asthma, uncomplicated: Secondary | ICD-10-CM

## 2023-09-23 ENCOUNTER — Ambulatory Visit (INDEPENDENT_AMBULATORY_CARE_PROVIDER_SITE_OTHER): Payer: 59

## 2023-09-23 DIAGNOSIS — J339 Nasal polyp, unspecified: Secondary | ICD-10-CM | POA: Diagnosis not present

## 2023-10-07 ENCOUNTER — Ambulatory Visit: Payer: 59

## 2023-10-07 DIAGNOSIS — J339 Nasal polyp, unspecified: Secondary | ICD-10-CM | POA: Diagnosis not present

## 2023-10-22 ENCOUNTER — Ambulatory Visit

## 2023-10-22 DIAGNOSIS — J339 Nasal polyp, unspecified: Secondary | ICD-10-CM | POA: Diagnosis not present

## 2023-11-05 ENCOUNTER — Ambulatory Visit

## 2023-11-05 DIAGNOSIS — J339 Nasal polyp, unspecified: Secondary | ICD-10-CM

## 2023-11-23 NOTE — Patient Instructions (Incomplete)
 Asthma: mild persistent,controlled  Continue montelukast  10 mg once a day to prevent cough or wheeze You may use albuterol  2 puffs once every 4 hours as needed for cough or wheeze You may use albuterol  2 puffs 5 to 15 minutes before activity to decrease cough or wheeze For asthma flare, begin Symbicort  160-2 puffs twice a day with a spacer for 2 weeks or until cough and wheeze free, then stop.  Allergic rhinitis; Continue montelukast  10 mg once a day for allergy symptoms Continue Fluticasone  1 spray per nostril twice daily  Continue azelastine  nasal spray 2 sprays in each nostril up to twice a day as needed for a runny nose.  Try this and see if this helps with the postnasal drip you are having. For increase nasal congestion/ drainage start budesonide  + saline rinses  twice daily for two weeks   History of nasal polyposis Continue  dupixent  injections 300mg  every 2 weeks   Reflux Continue dietary lifestyle modifications  Call the clinic if this treatment plan is not working well for you  Follow up: in 6 months

## 2023-11-24 ENCOUNTER — Other Ambulatory Visit: Payer: Self-pay | Admitting: Family Medicine

## 2023-11-24 ENCOUNTER — Encounter: Payer: Self-pay | Admitting: Family

## 2023-11-24 ENCOUNTER — Other Ambulatory Visit: Payer: Self-pay

## 2023-11-24 ENCOUNTER — Ambulatory Visit: Payer: 59 | Admitting: Family

## 2023-11-24 VITALS — BP 146/88 | HR 91 | Temp 98.4°F | Resp 18 | Wt 223.0 lb

## 2023-11-24 DIAGNOSIS — J4530 Mild persistent asthma, uncomplicated: Secondary | ICD-10-CM

## 2023-11-24 DIAGNOSIS — K219 Gastro-esophageal reflux disease without esophagitis: Secondary | ICD-10-CM | POA: Diagnosis not present

## 2023-11-24 DIAGNOSIS — J330 Polyp of nasal cavity: Secondary | ICD-10-CM | POA: Diagnosis not present

## 2023-11-24 DIAGNOSIS — J339 Nasal polyp, unspecified: Secondary | ICD-10-CM

## 2023-11-24 DIAGNOSIS — J3089 Other allergic rhinitis: Secondary | ICD-10-CM | POA: Diagnosis not present

## 2023-11-24 DIAGNOSIS — J302 Other seasonal allergic rhinitis: Secondary | ICD-10-CM

## 2023-11-24 NOTE — Progress Notes (Signed)
 [address] [place] [address] Dept: [phone]  FOLLOW UP NOTE  Patient ID: Isaiah Sellers, male    DOB: 1960/08/10  Age: 63 y.o. MRN: [id number] Date of Office Visit: 11/24/2023  Assessment  Chief Complaint: Follow-up (Follow up and dupixent )  HPI Isaiah Sellers is a 63 year old male who presents today for follow-up of mild persistent asthma-well-controlled, allergic rhinitis, history of nasal polyposis, and reflux.  He was last seen on August 26, 2023 by Dr. Jolayne Natter.  He denies any new diagnosis or surgery since his last office visit.  Asthma: He continues to take montelukast  10 mg daily.  He reports a cough that he feels like is probably due from the postnasal drip.  He denies wheezing, tightness in chest, shortness of breath, and nocturnal awakenings due to breathing problems.  Since his last office visit he has not required any systemic steroids or made any trips to the emergency room or urgent care due to breathing problems.  He has not had to use Symbicort  160/4.5 mcg during asthma flares.  The last time he had to use albuterol  was 2 weeks ago.  Allergic rhinitis with history of nasal polyposis: He reports that the pollen is thick this time of the year and when he is outside he will wear a mask.  He reports nasal congestion and postnasal drip.  He is able to taste and smell.  He continues to receive Dupixent  injections 300 mg every 2 weeks without any problems or reactions.  He does report Dupixent  helps with his nasal polyps.  He continues montelukast  10 mg daily, fluticasone  nasal spray, or saline rinse with budesonide .  He has azelastine  nasal spray that he uses as needed.  He uses azelastine  nasal spray mainly when he has an itch in his nose.  He has not been treated for any sinus infections since we last saw him.  Reflux: He reports that he tries to stay on top of his reflux.  He will take the Nexium  once every 2 to 3 days.  He does not let his Nexium  get out of  control.  He tries to cut down on the spicy foods that he eats.   Drug Allergies:  Allergies  Allergen Reactions   Chocolate Rash   Other Rash    All carbonated drinks (soda)    Review of Systems: Negative except as per HPI   Physical Exam: BP (!) 146/88   Pulse 91   Temp 98.4 F (36.9 C) (Temporal)   Resp 18   Wt 223 lb (101.2 kg)   SpO2 98%   BMI 30.24 kg/m    Physical Exam Constitutional:      Appearance: Normal appearance.  HENT:     Head: Normocephalic and atraumatic.     Comments: Pharynx normal, eyes normal, ears normal, nose: Bilateral lower turbinates mildly edematous and slightly erythematous with no drainage noted    Right Ear: Tympanic membrane, ear canal and external ear normal.     Left Ear: Tympanic membrane, ear canal and external ear normal.     Mouth/Throat:     Mouth: Mucous membranes are moist.     Pharynx: Oropharynx is clear.  Eyes:     Conjunctiva/sclera: Conjunctivae normal.  Cardiovascular:     Rate and Rhythm: Regular rhythm.     Heart sounds: Normal heart sounds.  Pulmonary:     Effort: Pulmonary effort is normal.     Breath sounds: Normal breath sounds.  Comments: Lungs clear to auscultation Musculoskeletal:     Cervical back: Neck supple.  Skin:    General: Skin is warm.  Neurological:     Mental Status: He is alert and oriented to person, place, and time.  Psychiatric:        Mood and Affect: Mood normal.        Behavior: Behavior normal.        Thought Content: Thought content normal.        Judgment: Judgment normal.     Diagnostics: None.  Will get spirometry at next office visit.  Assessment and Plan: 1. Mild persistent asthma without complication   2. Nose polyp   3. Seasonal and perennial allergic rhinitis   4. Gastroesophageal reflux disease without esophagitis     No orders of the defined types were placed in this encounter.   Patient Instructions  Asthma: mild persistent,controlled  Continue  montelukast  10 mg once a day to prevent cough or wheeze You may use albuterol  2 puffs once every 4 hours as needed for cough or wheeze You may use albuterol  2 puffs 5 to 15 minutes before activity to decrease cough or wheeze For asthma flare, begin Symbicort  160-2 puffs twice a day with a spacer for 2 weeks or until cough and wheeze free, then stop.  Allergic rhinitis; Continue montelukast  10 mg once a day for allergy symptoms Continue Fluticasone  1 spray per nostril twice daily  Continue azelastine  nasal spray 2 sprays in each nostril up to twice a day as needed for a runny nose.  Try this and see if this helps with the postnasal drip you are having. For increase nasal congestion/ drainage start budesonide  + saline rinses  twice daily for two weeks   History of nasal polyposis Continue  dupixent  injections 300mg  every 2 weeks   Reflux Continue dietary lifestyle modifications  Call the clinic if this treatment plan is not working well for you  Follow up: in 6 months     Return in about 6 months (around 05/25/2024), or if symptoms worsen or fail to improve.    Thank you for the opportunity to care for this patient.  Please do not hesitate to contact me with questions.  Tinnie Forehand, FNP [facility] 

## 2023-11-28 ENCOUNTER — Other Ambulatory Visit: Payer: Self-pay | Admitting: Internal Medicine

## 2023-12-10 ENCOUNTER — Ambulatory Visit

## 2023-12-11 ENCOUNTER — Ambulatory Visit

## 2023-12-11 DIAGNOSIS — J339 Nasal polyp, unspecified: Secondary | ICD-10-CM | POA: Diagnosis not present

## 2023-12-25 ENCOUNTER — Ambulatory Visit (INDEPENDENT_AMBULATORY_CARE_PROVIDER_SITE_OTHER)

## 2023-12-25 DIAGNOSIS — J330 Polyp of nasal cavity: Secondary | ICD-10-CM | POA: Diagnosis not present

## 2023-12-25 DIAGNOSIS — J339 Nasal polyp, unspecified: Secondary | ICD-10-CM

## 2023-12-28 ENCOUNTER — Other Ambulatory Visit: Payer: Self-pay | Admitting: Internal Medicine

## 2024-01-08 ENCOUNTER — Ambulatory Visit

## 2024-01-08 DIAGNOSIS — J330 Polyp of nasal cavity: Secondary | ICD-10-CM

## 2024-01-08 DIAGNOSIS — J339 Nasal polyp, unspecified: Secondary | ICD-10-CM

## 2024-01-22 ENCOUNTER — Ambulatory Visit

## 2024-01-22 DIAGNOSIS — J339 Nasal polyp, unspecified: Secondary | ICD-10-CM

## 2024-01-22 DIAGNOSIS — J330 Polyp of nasal cavity: Secondary | ICD-10-CM | POA: Diagnosis not present

## 2024-02-05 ENCOUNTER — Ambulatory Visit

## 2024-02-05 DIAGNOSIS — J330 Polyp of nasal cavity: Secondary | ICD-10-CM | POA: Diagnosis not present

## 2024-02-05 DIAGNOSIS — J339 Nasal polyp, unspecified: Secondary | ICD-10-CM

## 2024-02-11 NOTE — Patient Instructions (Incomplete)
 Asthma: mild persistent,controlled  Continue montelukast  10 mg once a day to prevent cough or wheeze You may use albuterol  2 puffs once every 4 hours as needed for cough or wheeze You may use albuterol  2 puffs 5 to 15 minutes before activity to decrease cough or wheeze For asthma flare,AND NOW begin Symbicort  160-2 puffs twice a day with a spacer for 2 weeks or until cough and wheeze free, then stop.  Allergic rhinitis; Continue montelukast  10 mg once a day for allergy symptoms Continue Fluticasone  1 spray per nostril twice daily. Do not use if using budesonide  with saline rinses. Continue azelastine  nasal spray 2 sprays in each nostril up to twice a day as needed for a runny nose.  Try this and see if this helps with the postnasal drip you are having. For increase nasal congestion/ drainage start budesonide  + saline rinses  twice daily for two weeks   History of nasal polyposis Continue  dupixent  injections 300mg  every 2 weeks for now.  He is possibly interested in stopping Dupixent  injections.  Discussed that studies should show that there is a 20 to 70% recurrence rate.  He will let us  know if he would like to stop Dupixent .   Reflux Continue dietary lifestyle modifications  Call the clinic if this treatment plan is not working well for you  Follow up: in 3 months or sooner if needed

## 2024-02-12 ENCOUNTER — Ambulatory Visit: Admitting: Family

## 2024-02-12 ENCOUNTER — Encounter: Payer: Self-pay | Admitting: Family

## 2024-02-12 VITALS — Temp 98.2°F | Resp 24 | Wt 225.0 lb

## 2024-02-12 DIAGNOSIS — K219 Gastro-esophageal reflux disease without esophagitis: Secondary | ICD-10-CM | POA: Diagnosis not present

## 2024-02-12 DIAGNOSIS — J330 Polyp of nasal cavity: Secondary | ICD-10-CM

## 2024-02-12 DIAGNOSIS — J4530 Mild persistent asthma, uncomplicated: Secondary | ICD-10-CM

## 2024-02-12 DIAGNOSIS — J302 Other seasonal allergic rhinitis: Secondary | ICD-10-CM

## 2024-02-12 DIAGNOSIS — J3089 Other allergic rhinitis: Secondary | ICD-10-CM

## 2024-02-12 MED ORDER — BUDESONIDE-FORMOTEROL FUMARATE 160-4.5 MCG/ACT IN AERO: INHALATION_SPRAY | RESPIRATORY_TRACT | 2 refills | Status: AC

## 2024-02-12 MED ORDER — ALBUTEROL SULFATE HFA 108 (90 BASE) MCG/ACT IN AERS: INHALATION_SPRAY | RESPIRATORY_TRACT | 1 refills | Status: AC

## 2024-02-12 NOTE — Progress Notes (Signed)
 [address] [place] [address] Dept: [phone]  FOLLOW UP NOTE  Patient ID: Isaiah Sellers, male    DOB: 1961/06/10  Age: 63 y.o. MRN: [id number] Date of Office Visit: 02/12/2024  Assessment  Chief Complaint: Follow-up (3 month follow up for asthma and allergies)  HPI Isaiah Sellers is a 63 year old male who presents today to discuss possibly stopping Dupixent  injections for nasal polyps.  He was last seen on November 24, 2023 by myself for mild persistent asthma without complication, nasal polyp, seasonal and perennial allergic rhinitis, and gastroesophageal reflux disease.  He denies any new diagnosis or surgery since his last office visit.  Mild persistent asthma: He mentions that he has a red inhaler, but does not know the name.  He does not think that he has an albuterol  inhaler.  He reports for the past 8 to 10 days he has had a dry cough that he attributes to the increased humidity.  He denies wheezing, tightness in chest, shortness of breath, nocturnal awakenings due to breathing problems, fever, and chills.  He does take montelukast  10 mg daily and reports he has used the red inhaler maybe 3-4 times a week, but not like he should.  Since his last office visit he has not required any systemic steroids or made any trips to the emergency room or urgent care due to breathing problems.  Allergic rhinitis: He reports nasal congestion at night and denies rhinorrhea and postnasal drip.  He has not been treated for any sinus infections since we last saw him.  He takes montelukast  10 mg daily and is not using fluticasone  nasal spray.  He reports that he knows that if he is using budesonide  with saline rinse he should not use fluticasone .  He has been using budesonide  nasal rinses as needed and he uses azelastine  nasal spray as needed.  History of of nasal polyposis: He is able to taste and smell.  He continues to receive Dupixent  injections every 2 weeks.  He denies any problems or  reactions with these injections, but is tired of getting the injections here in the office.  Discussed the option of getting his injections at home after we teach him.  He reports that he does not wish to do this.  He has had 1 sinus surgery back in 2020 by Dr. Karis.  He wanted to know the probability of his nasal polyps returning should he decide to quit Dupixent  injections.  Reflux: He reports that he continues to take Nexium  as needed.  He knows that his reflux will flareup if he eats something spicy so he tries to stay away.   Drug Allergies:  Allergies  Allergen Reactions   Chocolate Rash   Other Rash    All carbonated drinks (soda)    Review of Systems: Negative except as per HPI   Physical Exam: Temp 98.2 F (36.8 C) (Oral)   Resp (!) 24   Wt 225 lb (102.1 kg)   SpO2 98%   BMI 30.52 kg/m    Physical Exam Constitutional:      Appearance: Normal appearance.  HENT:     Head: Normocephalic and atraumatic.     Comments: Pharynx normal, eyes normal, ears normal, nose: Bilateral lower turbinates moderately edematous and erythematous with no drainage noted    Right Ear: Tympanic membrane, ear canal and external ear normal.     Left Ear: Tympanic membrane, ear canal and external ear normal.     Mouth/Throat:  Mouth: Mucous membranes are moist.     Pharynx: Oropharynx is clear.  Eyes:     Conjunctiva/sclera: Conjunctivae normal.  Cardiovascular:     Rate and Rhythm: Regular rhythm.     Heart sounds: Normal heart sounds.  Pulmonary:     Effort: Pulmonary effort is normal.     Breath sounds: Normal breath sounds.     Comments: Lungs clear to auscultation Musculoskeletal:     Cervical back: Neck supple.  Skin:    General: Skin is warm.  Neurological:     Mental Status: He is alert and oriented to person, place, and time.  Psychiatric:        Mood and Affect: Mood normal.        Behavior: Behavior normal.        Thought Content: Thought content normal.         Judgment: Judgment normal.     Diagnostics: FVC 4.44 L (105%), FEV1 3.38 L (104%), FEV1/FVC 0.76.  Spirometry indicates normal spirometry.  Assessment and Plan: 1. Polyp of nasal cavity   2. Not well controlled mild persistent asthma   3. Seasonal and perennial allergic rhinitis   4. Gastroesophageal reflux disease without esophagitis     Meds ordered this encounter  Medications   albuterol  (VENTOLIN  HFA) 108 (90 Base) MCG/ACT inhaler    Sig: INHALE 2 PUFFS INTO THE LUNGS EVERY 4-6 hours HOURS AS NEEDED FOR cough, wheeze, tightness in chest, or shortness of breath    Dispense:  18 g    Refill:  1   budesonide -formoterol  (SYMBICORT ) 160-4.5 MCG/ACT inhaler    Sig: During asthma flares start Symbicort  2 puffs twice a day with spacer for 2 weeks or until symptoms return to baseline.  Rinse mouth out afterwards.    Dispense:  1 each    Refill:  2    Patient Instructions  Asthma: mild persistent,controlled  Continue montelukast  10 mg once a day to prevent cough or wheeze You may use albuterol  2 puffs once every 4 hours as needed for cough or wheeze You may use albuterol  2 puffs 5 to 15 minutes before activity to decrease cough or wheeze For asthma flare,AND NOW begin Symbicort  160-2 puffs twice a day with a spacer for 2 weeks or until cough and wheeze free, then stop.  Allergic rhinitis; Continue montelukast  10 mg once a day for allergy symptoms Continue Fluticasone  1 spray per nostril twice daily. Do not use if using budesonide  with saline rinses. Continue azelastine  nasal spray 2 sprays in each nostril up to twice a day as needed for a runny nose.  Try this and see if this helps with the postnasal drip you are having. For increase nasal congestion/ drainage start budesonide  + saline rinses  twice daily for two weeks   History of nasal polyposis Continue  dupixent  injections 300mg  every 2 weeks for now.  He is possibly interested in stopping Dupixent  injections.  Discussed that  studies should show that there is a 20 to 70% recurrence rate.  He will let us  know if he would like to stop Dupixent .   Reflux Continue dietary lifestyle modifications  Call the clinic if this treatment plan is not working well for you  Follow up: in 3 months or sooner if needed   Return in about 3 months (around 05/14/2024), or if symptoms worsen or fail to improve.    Thank you for the opportunity to care for this patient.  Please do not hesitate to contact  me with questions.  Isaiah Craze, FNP [facility] 

## 2024-02-17 ENCOUNTER — Ambulatory Visit

## 2024-02-24 ENCOUNTER — Other Ambulatory Visit: Payer: Self-pay | Admitting: Family Medicine

## 2024-03-11 ENCOUNTER — Telehealth: Payer: Self-pay

## 2024-03-11 NOTE — Telephone Encounter (Signed)
 [employer] faxed notification in attempt to contact patient to refill dupixent  and update any changes to patient's therapy. Patient last office visit was 02/12/24 with Wanda Craze, FNP to discuss possibly stopping Dupixent  injections.

## 2024-05-26 NOTE — Patient Instructions (Incomplete)
 Asthma: mild persistent,controlled  Continue montelukast  10 mg once a day to prevent cough or wheeze You may use albuterol  2 puffs once every 4 hours as needed for cough or wheeze You may use albuterol  2 puffs 5 to 15 minutes before activity to decrease cough or wheeze For asthma flare,begin Symbicort  160-2 puffs twice a day with a spacer for 2 weeks or until cough and wheeze free, then stop.  Asthma control goals:  Full participation in all desired activities (may need albuterol  before activity) Albuterol  use two time or less a week on average (not counting use with activity) Cough interfering with sleep two time or less a month Oral steroids no more than once a year No hospitalizations   Allergic rhinitis; Continue montelukast  10 mg once a day for allergy symptoms Continue Fluticasone  1 spray per nostril twice daily. Do not use if using budesonide  with saline rinses. Continue azelastine  nasal spray 2 sprays in each nostril up to twice a day as needed for a runny nose.   For increase nasal congestion/ drainage start budesonide  + saline rinses  twice daily for two weeks. Do not use fluticasone  nasal spray while taking budesonide  in saline rinses  History of nasal polyposis Stopped Dupixent - last injection 02/05/24 .  Discussed that studies should show that there is a 20 to 70% recurrence rate.    Reflux Continue dietary lifestyle modifications  Call the clinic if this treatment plan is not working well for you  Follow up: in 3 months or sooner if needed

## 2024-05-27 ENCOUNTER — Ambulatory Visit: Admitting: Family

## 2024-05-27 ENCOUNTER — Encounter: Payer: Self-pay | Admitting: Family

## 2024-05-27 VITALS — BP 116/74 | HR 94 | Temp 98.2°F

## 2024-05-27 DIAGNOSIS — J302 Other seasonal allergic rhinitis: Secondary | ICD-10-CM

## 2024-05-27 DIAGNOSIS — K219 Gastro-esophageal reflux disease without esophagitis: Secondary | ICD-10-CM

## 2024-05-27 DIAGNOSIS — J4530 Mild persistent asthma, uncomplicated: Secondary | ICD-10-CM

## 2024-05-27 DIAGNOSIS — J339 Nasal polyp, unspecified: Secondary | ICD-10-CM | POA: Diagnosis not present

## 2024-05-27 DIAGNOSIS — J3089 Other allergic rhinitis: Secondary | ICD-10-CM

## 2024-05-27 MED ORDER — AZELASTINE HCL 0.1 % NA SOLN: NASAL | 5 refills | Status: AC

## 2024-05-27 NOTE — Progress Notes (Signed)
 [address] [place]  [address] Dept: [phone]  FOLLOW UP NOTE  Patient ID: Isaiah Sellers, male    DOB: May 04, 1961  Age: 63 y.o. MRN: [id number] Date of Office Visit: 05/27/2024  Assessment  Chief Complaint: Follow-up (Patient in office today for follow up after stopping dupixent  and would like to discuss possibly starting back.)  HPI Isaiah Sellers is a 63 year old male who presents today for follow-up of polyp of nasal cavity, not well-controlled mild persistent asthma, seasonal and perennial allergic rhinitis, and gastroesophageal reflux disease.  He was last seen by myself on February 12, 2024.  He denies any new diagnosis or surgeries since his last office visit.  Mild persistent asthma: He reports a little bit of coughing tightness in his chest with the weather changes.  He denies wheezing, shortness of breath, and nocturnal awakenings due to breathing problems.  Since his last office visit he has not required any systemic steroids or made any trips to the emergency room or urgent care due to breathing problems.  He does take montelukast  10 mg daily and uses Symbicort  as needed for asthma flares.  He reports he has used Symbicort  a few times and it has helped.  He will use his albuterol  every now and then, he does not use his albuterol  weekly.  Allergic rhinitis: He reports since changes in weather he has had some symptoms, but feels like he is getting it under better control.  He reports rhinorrhea and nasal congestion that occurs mainly at night.  He denies postnasal drip.  He has not been treated for any sinus infections since we last saw him.  He does take montelukast  10 mg daily and either uses fluticasone  nasal spray daily or saline rinse with budesonide  twice a day.  He does use azelastine  nasal spray as needed.  Also, he lately has been using Polysporin on a Q-tip in his nose for nasal dryness.  History of nasal polyposis: He reports that he called Tammy, our  Biologics coordinator, and told her that he wanted to stop Dupixent  injections.  His last Dupixent  injection was on February 05, 2024.  He reports that he is able to taste and smell, but mentions every now and then he will drink water and it will have a different taste.  This does not occur all the time.  He denies any sinus pressure or tenderness.  Reflux is reported as for the most part pretty good.  He may take Nexium  every once in a while.     Drug Allergies:  Allergies  Allergen Reactions   Chocolate Rash   Other Rash    All carbonated drinks (soda)    Review of Systems: Negative except as per HPI  Physical Exam: BP 116/74   Pulse 94   Temp 98.2 F (36.8 C)   SpO2 98%    Physical Exam Constitutional:      Appearance: Normal appearance.  HENT:     Head: Normocephalic and atraumatic.     Comments: Pharynx normal, eyes normal, ears normal, nose: Bilateral lower turbinates moderately edematous and slightly erythematous with clear drainage noted.  Left turbinate greater than right turbinate.    Right Ear: Tympanic membrane, ear canal and external ear normal.     Left Ear: Tympanic membrane, ear canal and external ear normal.     Mouth/Throat:     Mouth: Mucous membranes are moist.     Pharynx: Oropharynx is clear.  Eyes:     Conjunctiva/sclera:  Conjunctivae normal.  Cardiovascular:     Rate and Rhythm: Regular rhythm.     Heart sounds: Normal heart sounds.  Pulmonary:     Effort: Pulmonary effort is normal.     Breath sounds: Normal breath sounds.     Comments: Lungs clear to auscultation Musculoskeletal:     Cervical back: Neck supple.  Skin:    General: Skin is warm.  Neurological:     Mental Status: He is alert and oriented to person, place, and time.  Psychiatric:        Mood and Affect: Mood normal.        Behavior: Behavior normal.        Thought Content: Thought content normal.        Judgment: Judgment normal.     Diagnostics:  Will get spirometry at  your next office visit  Assessment and Plan: 1. Mild persistent asthma without complication   2. Seasonal and perennial allergic rhinitis   3. Nose polyp   4. Gastroesophageal reflux disease without esophagitis     Meds ordered this encounter  Medications   azelastine  (ASTELIN ) 0.1 % nasal spray    Sig: Place 1 to 2 sprays in each nostril once or twice a day as needed for runny nose/drainage down throat    Dispense:  30 mL    Refill:  5    Patient Instructions  Asthma: mild persistent Continue montelukast  10 mg once a day to prevent cough or wheeze You may use albuterol  2 puffs once every 4 hours as needed for cough or wheeze You may use albuterol  2 puffs 5 to 15 minutes before activity to decrease cough or wheeze For asthma flare,begin Symbicort  160-2 puffs twice a day with a spacer for 2 weeks or until cough and wheeze free, then stop.  Asthma control goals:  Full participation in all desired activities (may need albuterol  before activity) Albuterol  use two time or less a week on average (not counting use with activity) Cough interfering with sleep two time or less a month Oral steroids no more than once a year No hospitalizations   Allergic rhinitis; Continue montelukast  10 mg once a day for allergy symptoms Continue Fluticasone  1 spray per nostril twice daily. Do not use if using budesonide  with saline rinses. Continue azelastine  nasal spray 2 sprays in each nostril up to twice a day as needed for a runny nose.   For increase nasal congestion/ drainage start budesonide  + saline rinses  twice daily for two weeks. Do not use fluticasone  nasal spray while taking budesonide  in saline rinses. Discussed that he needs to use fluticasone  or saline rinses with budesonide  due to his history of nasal polyps Stop using polysporin in nose on q-tip Start using saline gel to help with nasal dryness. Sample given Consider referring to ENT if you continue to have changes in taste  History  of nasal polyposis Stopped Dupixent - last injection 02/05/24 .  Discussed that studies should show that there is a 20 to 70% recurrence rate.    Reflux Continue dietary lifestyle modifications as below Call the clinic if this treatment plan is not working well for you  Follow up: in 3-4 months or sooner if needed  Lifestyle Changes for Controlling GERD When you have GERD, stomach acid feels as if it's backing up toward your mouth. Whether or not you take medication to control your GERD, your symptoms can often be improved with lifestyle changes.   Raise Your Head Reflux is more  likely to strike when you're lying down flat, because stomach fluid can flow backward more easily. Raising the head of your bed 4-6 inches can help. To do this: Slide blocks or books under the legs at the head of your bed. Or, place a wedge under the mattress. Many foam stores can make a suitable wedge for you. The wedge should run from your waist to the top of your head. Don't just prop your head on several pillows. This increases pressure on your stomach. It can make GERD worse.  Watch Your Eating Habits Certain foods may increase the acid in your stomach or relax the lower esophageal sphincter, making GERD more likely. It's best to avoid the following: Coffee, tea, and carbonated drinks (with and without caffeine) Fatty, fried, or spicy food Mint, chocolate, onions, and tomatoes Any other foods that seem to irritate your stomach or cause you pain  Relieve the Pressure Eat smaller meals, even if you have to eat more often. Don't lie down right after you eat. Wait a few hours for your stomach to empty. Avoid tight belts and tight-fitting clothes. Lose excess weight.  Tobacco and Alcohol Avoid smoking tobacco and drinking alcohol. They can make GERD symptoms worse.   Return in about 4 months (around 09/25/2024), or if symptoms worsen or fail to improve.    Thank you for the opportunity to care for  this patient.  Please do not hesitate to contact me with questions.  Wanda Craze, FNP [facility] 

## 2024-08-26 NOTE — Patient Instructions (Incomplete)
 Asthma: mild persistent Continue montelukast  10 mg once a day to prevent cough or wheeze You may use albuterol  2 puffs once every 4 hours as needed for cough or wheeze You may use albuterol  2 puffs 5 to 15 minutes before activity to decrease cough or wheeze For asthma flare,begin Symbicort  160-2 puffs twice a day with a spacer for 2 weeks or until cough and wheeze free, then stop.  Asthma control goals:  Full participation in all desired activities (may need albuterol  before activity) Albuterol  use two time or less a week on average (not counting use with activity) Cough interfering with sleep two time or less a month Oral steroids no more than once a year No hospitalizations   Allergic rhinitis; Continue montelukast  10 mg once a day for allergy symptoms Continue Fluticasone  1 spray per nostril twice daily. Do not use if using budesonide  with saline rinses. Continue azelastine  nasal spray 2 sprays in each nostril up to twice a day as needed for a runny nose.   For increase nasal congestion/ drainage start budesonide  + saline rinses  twice daily for two weeks. Do not use fluticasone  nasal spray while taking budesonide  in saline rinses. Discussed that he needs to use fluticasone  or saline rinses with budesonide  due to his history of nasal polyps Continue  saline gel  as needed to help with nasal dryness. Consider referring to ENT if you continue to have changes in taste  History of nasal polyposis Stopped Dupixent - last injection 02/05/24 .  Discussed that studies should show that there is a 20 to 70% recurrence rate.    Reflux Continue dietary lifestyle modifications as below Call the clinic if this treatment plan is not working well for you  Follow up: in months or sooner if needed  Lifestyle Changes for Controlling GERD When you have GERD, stomach acid feels as if its backing up toward your mouth. Whether or not you take medication to control your GERD, your symptoms can often  be improved with lifestyle changes.   Raise Your Head Reflux is more likely to strike when youre lying down flat, because stomach fluid can flow backward more easily. Raising the head of your bed 4-6 inches can help. To do this: Slide blocks or books under the legs at the head of your bed. Or, place a wedge under the mattress. Many foam stores can make a suitable wedge for you. The wedge should run from your waist to the top of your head. Dont just prop your head on several pillows. This increases pressure on your stomach. It can make GERD worse.  Watch Your Eating Habits Certain foods may increase the acid in your stomach or relax the lower esophageal sphincter, making GERD more likely. Its best to avoid the following: Coffee, tea, and carbonated drinks (with and without caffeine) Fatty, fried, or spicy food Mint, chocolate, onions, and tomatoes Any other foods that seem to irritate your stomach or cause you pain  Relieve the Pressure Eat smaller meals, even if you have to eat more often. Dont lie down right after you eat. Wait a few hours for your stomach to empty. Avoid tight belts and tight-fitting clothes. Lose excess weight.  Tobacco and Alcohol Avoid smoking tobacco and drinking alcohol. They can make GERD symptoms worse.

## 2024-08-27 ENCOUNTER — Ambulatory Visit: Admitting: Family
